# Patient Record
Sex: Female | Born: 1985
Health system: Southern US, Community
[De-identification: ages and names within clinical notes are randomized; demographics above are authoritative.]

## PROBLEM LIST (undated history)

## (undated) DIAGNOSIS — N390 Urinary tract infection, site not specified: Secondary | ICD-10-CM

## (undated) DIAGNOSIS — Z720 Tobacco use: Secondary | ICD-10-CM

## (undated) DIAGNOSIS — D649 Anemia, unspecified: Secondary | ICD-10-CM

## (undated) DIAGNOSIS — F1910 Other psychoactive substance abuse, uncomplicated: Secondary | ICD-10-CM

## (undated) DIAGNOSIS — R768 Other specified abnormal immunological findings in serum: Secondary | ICD-10-CM

## (undated) DIAGNOSIS — K219 Gastro-esophageal reflux disease without esophagitis: Secondary | ICD-10-CM

## (undated) HISTORY — PX: ABCESS DRAINAGE: SHX399

---
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
## 2003-06-11 ENCOUNTER — Emergency Department (HOSPITAL_COMMUNITY): Admission: EM | Admit: 2003-06-11 | Discharge: 2003-06-11 | Payer: Self-pay | Admitting: Emergency Medicine

## 2003-12-10 ENCOUNTER — Emergency Department (HOSPITAL_COMMUNITY): Admission: EM | Admit: 2003-12-10 | Discharge: 2003-12-10 | Payer: Self-pay | Admitting: Emergency Medicine

## 2010-04-29 ENCOUNTER — Emergency Department (HOSPITAL_COMMUNITY): Admission: EM | Admit: 2010-04-29 | Discharge: 2010-04-29 | Payer: Self-pay | Admitting: Family Medicine

## 2010-12-31 ENCOUNTER — Emergency Department (HOSPITAL_COMMUNITY)
Admission: EM | Admit: 2010-12-31 | Discharge: 2010-12-31 | Payer: Self-pay | Source: Home / Self Care | Admitting: Emergency Medicine

## 2011-02-20 LAB — POCT I-STAT, CHEM 8
BUN: 11 mg/dL (ref 6–23)
Calcium, Ion: 1.13 mmol/L (ref 1.12–1.32)
Chloride: 105 mEq/L (ref 96–112)
Creatinine, Ser: 0.8 mg/dL (ref 0.4–1.2)
Glucose, Bld: 97 mg/dL (ref 70–99)
HCT: 44 % (ref 36.0–46.0)
Hemoglobin: 15 g/dL (ref 12.0–15.0)
Potassium: 3.9 mEq/L (ref 3.5–5.1)
Sodium: 140 mEq/L (ref 135–145)
TCO2: 26 mmol/L (ref 0–100)

## 2011-02-20 LAB — POCT URINALYSIS DIP (DEVICE)
Glucose, UA: NEGATIVE mg/dL
Ketones, ur: NEGATIVE mg/dL
Nitrite: NEGATIVE
Protein, ur: >=300 mg/dL — AB
Specific Gravity, Urine: 1.015 (ref 1.005–1.030)
Urobilinogen, UA: 0.2 mg/dL (ref 0.0–1.0)
pH: >=9 (ref 5.0–8.0)

## 2011-02-20 LAB — POCT PREGNANCY, URINE: Preg Test, Ur: NEGATIVE

## 2011-03-15 ENCOUNTER — Emergency Department (HOSPITAL_COMMUNITY)
Admission: EM | Admit: 2011-03-15 | Discharge: 2011-03-15 | Disposition: A | Discharge status: Home / Self Care | Payer: Self-pay | Attending: Emergency Medicine | Admitting: Emergency Medicine

## 2011-03-15 DIAGNOSIS — J029 Acute pharyngitis, unspecified: Secondary | ICD-10-CM | POA: Insufficient documentation

## 2011-03-15 DIAGNOSIS — F17200 Nicotine dependence, unspecified, uncomplicated: Secondary | ICD-10-CM | POA: Insufficient documentation

## 2011-04-21 NOTE — Discharge Summary (Signed)
Bonnie Yang, Bonnie Yang                          ACCOUNT NO.:  [id number]   MEDICAL RECORD NO.:  [id number]                   PATIENT TYPE:  INP   LOCATION:  6711                                 FACILITY:  [facility]   PHYSICIAN:  Rene Paci, M.D. [facility]          DATE OF BIRTH:  18-Aug-1986   DATE OF ADMISSION:  12/23/2002  DATE OF DISCHARGE:  12/25/2002                                 DISCHARGE SUMMARY   DISCHARGE DIAGNOSES:  1. Pharyngitis.  2. Tonsillitis.  3. Otitis media.   HISTORY:  Ms. Catlin is an 25 year old African American female who had her  tongue pierced recently.  The patient's mother reported that she went to a  local hospital on December 10, 2002 secondary to sore throat and her tongue  swelling.  At that time the ring was removed from her tongue and she  received IV Rocephin and IV Cleocin.  She was then discharged home.  She was  re-evaluated at the [facility] emergency room on December 21, 2002 and  December 22, 2002 respectively and received Rocephin and IV steroids each  time.  She presented to Dr. Leta Jungling office on the day of admission because of  severe sore throat limiting her ability to swallow secondary to pain.  She  was also noted to have a low grade fever.   PAST MEDICAL HISTORY:  Negative.   HOSPITAL COURSE:  ENT.  The patient presented with severe tonsillitis and  pharyngitis and probable otitis media.  She was admitted and started on IV  antibiotics as well as IV steroids.  The patient's condition has slowly  improved.  She has defervesced.  The patient will be tapered to oral  steroids and oral antibiotics.  We have arranged for the patient to follow  up with ENT as an outpatient.   LABORATORY DATA:  Urine pregnancy was negative.  White count was 11.5,  hemoglobin 14.1.   PHYSICAL EXAMINATION:  VITALS:  At discharge she was afebrile.  Blood  pressure 104/56, respirations 18, pulse 86, oxygen saturation is 96% on room  air.  GENERAL:  This is a thin,  well-developed, well-nourished African American  female in no acute distress.  HEENT:  Oropharynx feels protruding, erythematous.  Tonsils with exudate on  the left tonsil.  She does have some cervical adenopathy.  The right ear  canal was occluded with cerumen.  Left ear canal also had a little bit of  cerumen but it was not occluded.  Her tympanic membrane was mildly hyperemic  without any evidence of discharge or bulging.  LUNGS:  Clear, without wheezes, rales or rhonchi.  CARDIOVASCULAR:  Regular, without murmurs, rubs or gallops.  Otherwise exam  was benign.   DISCHARGE MEDICATIONS:  1. Prednisone 20 mg taper over the next six days.  2. Augmentin 875 mg b.i.d. for 11 days.  3. Lortab elixir 15 ml every six hours p.r.n.    FOLLOW  UP:  She is to follow up with Dr. Nedra Hai on Friday, January 23 at 9:30  a.m. and Dr. Drue Novel Monday, January 26 at 3:45 p.m.     Cornell Barman, P.A. [facility]                  Rene Paci, M.D. [facility]    LC/MEDQ  D:  12/25/2002  T:  12/26/2002  Job:  [id number]

## 2011-10-21 ENCOUNTER — Emergency Department (HOSPITAL_COMMUNITY)
Admission: EM | Admit: 2011-10-21 | Discharge: 2011-10-21 | Disposition: A | Discharge status: Home / Self Care | Payer: Self-pay | Attending: Emergency Medicine | Admitting: Emergency Medicine

## 2011-10-21 ENCOUNTER — Emergency Department (HOSPITAL_COMMUNITY): Payer: Self-pay

## 2011-10-21 DIAGNOSIS — G5600 Carpal tunnel syndrome, unspecified upper limb: Secondary | ICD-10-CM | POA: Insufficient documentation

## 2011-10-21 DIAGNOSIS — G5601 Carpal tunnel syndrome, right upper limb: Secondary | ICD-10-CM

## 2011-10-21 DIAGNOSIS — M79609 Pain in unspecified limb: Secondary | ICD-10-CM | POA: Insufficient documentation

## 2011-10-21 DIAGNOSIS — F17200 Nicotine dependence, unspecified, uncomplicated: Secondary | ICD-10-CM | POA: Insufficient documentation

## 2011-10-21 MED ORDER — IBUPROFEN 800 MG PO TABS
800.0000 mg | ORAL_TABLET | Freq: Once | ORAL | Status: AC
  Administered 2011-10-21: 800 mg via ORAL
  Filled 2011-10-21: qty 1

## 2011-10-21 NOTE — ED Notes (Signed)
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  Pt says her grip is weaker in r hand as well.  Pt able to move fingers, radial pulse present.  Also reports hand has been tingling off and on.

## 2011-10-21 NOTE — ED Provider Notes (Signed)
History     CSN: [id number] Arrival date & time: 10/21/2011  4:25 PM   First MD Initiated Contact with Patient 10/21/11 1648      Chief Complaint  Patient presents with  . 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  She has pain in the R 3rd PIP and in the R thenar imminence.  She also describes dysthesia in the first 3  Fingers.    No prior history of carpal tunnel syndrome.  Patient is a 25 y.o. female presenting with hand pain. The history is provided by the patient. No language interpreter was used.  Hand Pain This is a new problem. The current episode started in the past 7 days. The problem occurs daily. The problem has been gradually worsening. She has tried NSAIDs for the symptoms. The treatment provided mild relief.    History reviewed. No pertinent past medical history.  History reviewed. No pertinent past surgical history.  No family history on file.  History  Substance Use Topics  . Smoking status: Current Everyday Smoker  . Smokeless tobacco: Not on file  . Alcohol Use: Yes     occasionally    OB History    Grav Para Term Preterm Abortions TAB SAB Ect Mult Living                  Review of Systems  Musculoskeletal:       Hand pain and tingling.  All other systems reviewed and are negative.    Allergies  Review of patient's allergies indicates no known allergies.  Home Medications  No current outpatient prescriptions on file.  BP 108/66  Pulse 81  Temp(Src) 98.1 F (36.7 C) (Oral)  Resp 18  Ht 5\' 5"  (1.651 m)  Wt 110 lb (49.896 kg)  BMI 18.31 kg/m2  SpO2 100%  LMP 10/13/2011  Physical Exam  Nursing note and vitals reviewed. Constitutional: She is oriented to person, place, and time. She appears well-developed and well-nourished. No distress.  HENT:  Head: Normocephalic and atraumatic.    Eyes: EOM are normal.  Neck: Normal range of motion.  Cardiovascular: Normal rate, regular rhythm and normal heart sounds.   Pulmonary/Chest: Effort normal and breath sounds normal.  Abdominal: Soft. She exhibits no distension. There is no tenderness.  Musculoskeletal: She exhibits tenderness.       Right hand: She exhibits decreased range of motion, tenderness and bony tenderness. She exhibits normal two-point discrimination, normal capillary refill, no deformity, no laceration and no swelling. normal sensation noted. Normal strength noted.       Hands: Neurological: She is alert and oriented to person, place, and time.  Skin: Skin is warm and dry.  Psychiatric: She has a normal mood and affect. Judgment normal.    ED Course  Procedures (including critical care time)  Labs Reviewed - No data to display No results found.   No diagnosis found.    MDM          Worthy Rancher, PA 10/21/11 Rickey Primus

## 2011-10-21 NOTE — ED Provider Notes (Signed)
Medical screening examination/treatment/procedure(s) were performed by non-physician practitioner and as supervising physician I was immediately available for consultation/collaboration.  Donnetta Hutching, MD 10/21/11 [phone]

## 2011-10-21 NOTE — ED Notes (Signed)
Pt a/ox4. Resp even and unlabored. NAD at this time. D/C instructions reviewed with pt. Pt verbalized understanding. Pt ambulated to POV with steady gate. 

## 2011-10-22 ENCOUNTER — Emergency Department (HOSPITAL_COMMUNITY)
Admission: EM | Admit: 2011-10-22 | Discharge: 2011-10-22 | Disposition: A | Discharge status: Home / Self Care | Payer: Self-pay | Attending: Emergency Medicine | Admitting: Emergency Medicine

## 2011-10-22 ENCOUNTER — Encounter (HOSPITAL_COMMUNITY): Payer: Self-pay

## 2011-10-22 DIAGNOSIS — M79609 Pain in unspecified limb: Secondary | ICD-10-CM | POA: Insufficient documentation

## 2011-10-22 DIAGNOSIS — M25539 Pain in unspecified wrist: Secondary | ICD-10-CM | POA: Insufficient documentation

## 2011-10-22 DIAGNOSIS — M79603 Pain in arm, unspecified: Secondary | ICD-10-CM

## 2011-10-22 DIAGNOSIS — F17200 Nicotine dependence, unspecified, uncomplicated: Secondary | ICD-10-CM | POA: Insufficient documentation

## 2011-10-22 DIAGNOSIS — G5600 Carpal tunnel syndrome, unspecified upper limb: Secondary | ICD-10-CM | POA: Insufficient documentation

## 2011-10-22 MED ORDER — HYDROCODONE-ACETAMINOPHEN 5-325 MG PO TABS: 1.0000 | ORAL_TABLET | ORAL | Status: AC | PRN

## 2011-10-22 MED ORDER — HYDROCODONE-ACETAMINOPHEN 5-325 MG PO TABS
1.0000 | ORAL_TABLET | Freq: Once | ORAL | Status: AC
  Administered 2011-10-22: 1 via ORAL
  Filled 2011-10-22: qty 1

## 2011-10-22 NOTE — ED Notes (Signed)
Pt seen her earlier today, given wrist splint to right wrist,  States pain is worse

## 2011-10-22 NOTE — ED Provider Notes (Signed)
History     CSN: [id number] Arrival date & time: 10/22/2011 12:38 AM   First MD Initiated Contact with Patient 10/22/11 0040      Chief Complaint  Patient presents with  . Arm Pain    (Consider location/radiation/quality/duration/timing/severity/associated sxs/prior treatment) HPI Comments: Bonnie Yang is a 25 y.o. female who presents to the Emergency Department complaining of left arm pain. Patient was seen in the ER earlier and diagnosed with carpel tunnel. She was placed in a velcro splint and advised to use an antiinflammatory. She states that the pain to her left arm got worse and did not respond to the antiinflammatory. She is not currently wearing her velcro splint.  Patient is a 25 y.o. female presenting with arm pain. The history is provided by the patient.  Arm Pain This is a new problem. The current episode started 6 to 12 hours ago. The problem occurs constantly. The problem has not changed since onset.The symptoms are aggravated by nothing. The symptoms are relieved by nothing.    History reviewed. No pertinent past medical history.  History reviewed. No pertinent past surgical history.  History reviewed. No pertinent family history.  History  Substance Use Topics  . Smoking status: Current Everyday Smoker  . Smokeless tobacco: Not on file  . Alcohol Use: Yes     occasionally    OB History    Grav Para Term Preterm Abortions TAB SAB Ect Mult Living                  Review of Systems  Musculoskeletal:       Left arm and wrist pain  All other systems reviewed and are negative.    Allergies  Review of patient's allergies indicates no known allergies.  Home Medications   Current Outpatient Rx  Name Route Sig Dispense Refill  . IBUPROFEN 800 MG PO TABS Oral Take 800 mg by mouth every 8 (eight) hours as needed.        BP 99/66  Pulse 88  Temp(Src) 98.3 F (36.8 C) (Oral)  Resp 16  SpO2 100%  LMP 10/13/2011  Physical Exam  Nursing note and  vitals reviewed. Constitutional: She is oriented to person, place, and time. She appears well-developed and well-nourished. No distress.  HENT:  Head: Normocephalic and atraumatic.  Eyes: EOM are normal.  Neck: Normal range of motion.  Cardiovascular: Normal rate, normal heart sounds and intact distal pulses.   Pulmonary/Chest: Effort normal and breath sounds normal.  Musculoskeletal:       Right upper arm: She exhibits no tenderness, no bony tenderness, no swelling, no edema and no deformity.       Right forearm: She exhibits no tenderness, no swelling, no edema and no deformity.       Right hand: She exhibits normal range of motion and no tenderness. normal sensation noted. Normal strength noted.  Neurological: She is alert and oriented to person, place, and time.  Skin: Skin is warm and dry.    ED Course  Procedures (including critical care time)  Labs Reviewed - No data to display Dg Hand Complete Right  10/21/2011  *RADIOLOGY REPORT*  Clinical Data: Hand pain, no injury  RIGHT HAND - COMPLETE 3+ VIEW  Comparison:  None.  Findings:  There is no evidence of fracture or dislocation.  There is no evidence of arthropathy or other focal bone abnormality. Soft tissues are unremarkable.  IMPRESSION: Negative.  Original Report Authenticated By: Camelia Phenes, M.D.  MDM  Patient here with continued left hand and arm pain that has not responded to antiinflammatory. Requesting stronger medicine. Xray done earlier with no acute findings. Patient was given a velcro splint which she was not wearing. Given analgesics.Pt stable in ED with no significant deterioration in condition.The patient appears reasonably screened and/or stabilized for discharge and I doubt any other medical condition or other [facility] requiring further screening, evaluation, or treatment in the ED at this time prior to discharge.  MDM Reviewed: previous chart, nursing note and vitals Reviewed previous:  x-ray          Nicoletta Dress. Colon Branch, MD 10/22/11 [phone]

## 2011-11-09 ENCOUNTER — Emergency Department (HOSPITAL_COMMUNITY): Payer: Self-pay

## 2011-11-09 ENCOUNTER — Emergency Department (HOSPITAL_COMMUNITY)
Admission: EM | Admit: 2011-11-09 | Discharge: 2011-11-09 | Disposition: A | Discharge status: Home / Self Care | Payer: Self-pay | Attending: Emergency Medicine | Admitting: Emergency Medicine

## 2011-11-09 ENCOUNTER — Encounter (HOSPITAL_COMMUNITY): Payer: Self-pay | Admitting: Emergency Medicine

## 2011-11-09 DIAGNOSIS — R1031 Right lower quadrant pain: Secondary | ICD-10-CM | POA: Insufficient documentation

## 2011-11-09 DIAGNOSIS — F17200 Nicotine dependence, unspecified, uncomplicated: Secondary | ICD-10-CM | POA: Insufficient documentation

## 2011-11-09 DIAGNOSIS — A499 Bacterial infection, unspecified: Secondary | ICD-10-CM | POA: Insufficient documentation

## 2011-11-09 DIAGNOSIS — N760 Acute vaginitis: Secondary | ICD-10-CM | POA: Insufficient documentation

## 2011-11-09 DIAGNOSIS — B9689 Other specified bacterial agents as the cause of diseases classified elsewhere: Secondary | ICD-10-CM | POA: Insufficient documentation

## 2011-11-09 DIAGNOSIS — N390 Urinary tract infection, site not specified: Secondary | ICD-10-CM | POA: Insufficient documentation

## 2011-11-09 DIAGNOSIS — R110 Nausea: Secondary | ICD-10-CM | POA: Insufficient documentation

## 2011-11-09 LAB — WET PREP, GENITAL
WBC, Wet Prep HPF POC: NONE SEEN
Yeast Wet Prep HPF POC: NONE SEEN

## 2011-11-09 LAB — DIFFERENTIAL
Basophils Absolute: 0 10*3/uL (ref 0.0–0.1)
Basophils Relative: 1 % (ref 0–1)
Lymphocytes Relative: 17 % (ref 12–46)
Monocytes Absolute: 1.2 10*3/uL — ABNORMAL HIGH (ref 0.1–1.0)
Monocytes Relative: 17 % — ABNORMAL HIGH (ref 3–12)
Neutro Abs: 4.7 10*3/uL (ref 1.7–7.7)
Neutrophils Relative %: 65 % (ref 43–77)

## 2011-11-09 LAB — BASIC METABOLIC PANEL
CO2: 26 mEq/L (ref 19–32)
Chloride: 102 mEq/L (ref 96–112)
GFR calc Af Amer: 82 mL/min — ABNORMAL LOW (ref >90)
Potassium: 3.6 mEq/L (ref 3.5–5.1)

## 2011-11-09 LAB — CBC
HCT: 38.5 % (ref 36.0–46.0)
Hemoglobin: 13 g/dL (ref 12.0–15.0)
MCHC: 33.8 g/dL (ref 30.0–36.0)
RDW: 13 % (ref 11.5–15.5)
WBC: 7.3 10*3/uL (ref 4.0–10.5)

## 2011-11-09 LAB — URINALYSIS, ROUTINE W REFLEX MICROSCOPIC
Glucose, UA: NEGATIVE mg/dL
Protein, ur: 100 mg/dL — AB
Urobilinogen, UA: 0.2 mg/dL (ref 0.0–1.0)

## 2011-11-09 LAB — URINE MICROSCOPIC-ADD ON

## 2011-11-09 LAB — PREGNANCY, URINE: Preg Test, Ur: NEGATIVE

## 2011-11-09 MED ORDER — OXYCODONE-ACETAMINOPHEN 5-325 MG PO TABS: 2.0000 | ORAL_TABLET | ORAL | Status: AC | PRN

## 2011-11-09 MED ORDER — IOHEXOL 300 MG/ML  SOLN
100.0000 mL | Freq: Once | INTRAMUSCULAR | Status: AC | PRN
  Administered 2011-11-09: 100 mL via INTRAVENOUS

## 2011-11-09 MED ORDER — SODIUM CHLORIDE 0.9 % IV SOLN
INTRAVENOUS | Status: DC
  Administered 2011-11-09: 1000 mL via INTRAVENOUS

## 2011-11-09 MED ORDER — NITROFURANTOIN MONOHYD MACRO 100 MG PO CAPS: 100.0000 mg | ORAL_CAPSULE | Freq: Two times a day (BID) | ORAL | Status: AC

## 2011-11-09 MED ORDER — ONDANSETRON HCL 4 MG/2ML IJ SOLN
4.0000 mg | Freq: Once | INTRAMUSCULAR | Status: AC
  Administered 2011-11-09: 4 mg via INTRAVENOUS
  Filled 2011-11-09: qty 2

## 2011-11-09 MED ORDER — ONDANSETRON HCL 4 MG PO TABS: 4.0000 mg | ORAL_TABLET | Freq: Four times a day (QID) | ORAL | Status: AC

## 2011-11-09 MED ORDER — METRONIDAZOLE 500 MG PO TABS: 500.0000 mg | ORAL_TABLET | Freq: Two times a day (BID) | ORAL | Status: AC

## 2011-11-09 NOTE — ED Notes (Signed)
Pt to CT

## 2011-11-09 NOTE — ED Provider Notes (Signed)
History     CSN: [id number] Arrival date & time: 11/09/2011  4:33 AM   First MD Initiated Contact with Patient 11/09/11 [phone]      Chief Complaint  Patient presents with  . Abdominal Pain  . Nausea    (Consider location/radiation/quality/duration/timing/severity/associated sxs/prior treatment) HPI Comments: Patient presents with right-sided abdominal pain is getting progressively worse for the past 2 days. It conincided with the started her menstrual period on Monday but this is nothing like her normal menstrual cramps. The pain is in the right lower quadrant and does not radiate. Nothing makes it better or worse. Associated with nausea but no vomiting. She denies any dysuria, hematuria, back pain. She denies any fever, shortness of breath, chest pain. She does endorse anorexia and frequent episodes of diarrhea yesterday.  Patient is a 25 y.o. female presenting with abdominal pain.  Abdominal Pain The primary symptoms of the illness include abdominal pain, nausea, diarrhea and vaginal bleeding. The primary symptoms of the illness do not include fever, shortness of breath, vomiting or dysuria. The current episode started 2 days ago. The problem has been gradually worsening.  The patient states that she believes she is currently not pregnant. The patient has not had a change in bowel habit. Additional symptoms associated with the illness include anorexia and back pain. Symptoms associated with the illness do not include frequency.    History reviewed. No pertinent past medical history.  History reviewed. No pertinent past surgical history.  No family history on file.  History  Substance Use Topics  . Smoking status: Current Some Day Smoker  . Smokeless tobacco: Not on file  . Alcohol Use: Yes     occasionally    OB History    Grav Para Term Preterm Abortions TAB SAB Ect Mult Living                  Review of Systems  Constitutional: Positive for activity change. Negative for  fever.  HENT: Negative for congestion and rhinorrhea.   Respiratory: Negative for cough and shortness of breath.   Cardiovascular: Negative for chest pain.  Gastrointestinal: Positive for nausea, abdominal pain, diarrhea and anorexia. Negative for vomiting.  Genitourinary: Positive for vaginal bleeding. Negative for dysuria and frequency.  Musculoskeletal: Positive for back pain.  Skin: Negative for rash.  Neurological: Negative for weakness and headaches.    Allergies  Review of patient's allergies indicates no known allergies.  Home Medications   Current Outpatient Rx  Name Route Sig Dispense Refill  . IBUPROFEN 800 MG PO TABS Oral Take 800 mg by mouth every 8 (eight) hours as needed.        BP 123/76  Pulse 85  Temp(Src) 97.9 F (36.6 C) (Oral)  Resp 18  Ht 5\' 5"  (1.651 m)  Wt 112 lb (50.803 kg)  BMI 18.64 kg/m2  SpO2 100%  LMP 11/06/2011  Physical Exam  Constitutional: She is oriented to person, place, and time. She appears well-developed and well-nourished. No distress.  HENT:  Head: Normocephalic and atraumatic.  Mouth/Throat: No oropharyngeal exudate.  Eyes: Pupils are equal, round, and reactive to light.  Neck: Normal range of motion.  Cardiovascular: Normal rate, regular rhythm and normal heart sounds.   Pulmonary/Chest: Effort normal and breath sounds normal. No respiratory distress.  Abdominal: Soft. There is tenderness. There is guarding.       Right lower quadrant tenderness at McBurney's point with guarding. Negative Rovsing sign negative obturator  Musculoskeletal: Normal range of motion. She  exhibits no edema and no tenderness.  Neurological: She is alert and oriented to person, place, and time. No cranial nerve deficit.  Skin: Skin is warm.    ED Course  Procedures (including critical care time)  Labs Reviewed  URINALYSIS, ROUTINE W REFLEX MICROSCOPIC - Abnormal; Notable for the following:    Color, Urine BROWN (*) BIOCHEMICALS MAY BE AFFECTED BY  COLOR   APPearance CLOUDY (*)    Specific Gravity, Urine >1.030 (*)    Hgb urine dipstick LARGE (*)    Bilirubin Urine SMALL (*)    Ketones, ur 15 (*)    Protein, ur 100 (*)    All other components within normal limits  URINE MICROSCOPIC-ADD ON - Abnormal; Notable for the following:    Squamous Epithelial / LPF FEW (*)    Bacteria, UA MANY (*)    All other components within normal limits  PREGNANCY, URINE  CBC  DIFFERENTIAL  BASIC METABOLIC PANEL  WET PREP, GENITAL  GC/CHLAMYDIA PROBE AMP, GENITAL   No results found.   No diagnosis found.    MDM  RLQ pain with nausea and anorexia.  Currently on period.  Will evaluate UA, HCG, pelvic.  May need imaging for appendix.  UA with blood as patient is on period.  Pelvic exam benign; no adnexal pain. Labs reviewed. CT pending to evaluate appendix.  Patient signed out to Dr Adriana Simas.      Glynn Octave, MD 11/09/11 [phone]

## 2011-11-09 NOTE — ED Notes (Signed)
C/o right sided pain since Monday; states period started Monday, but this doesn't feel like her normal menstrual cramps.

## 2011-11-09 NOTE — ED Provider Notes (Signed)
History     CSN: [id number] Arrival date & time: 11/09/2011  4:33 AM   First MD Initiated Contact with Patient 11/09/11 [phone]      Chief Complaint  Patient presents with  . Abdominal Pain  . Nausea    (Consider location/radiation/quality/duration/timing/severity/associated sxs/prior treatment) HPI  History reviewed. No pertinent past medical history.  History reviewed. No pertinent past surgical history.  No family history on file.  History  Substance Use Topics  . Smoking status: Current Some Day Smoker  . Smokeless tobacco: Not on file  . Alcohol Use: Yes     occasionally    OB History    Grav Para Term Preterm Abortions TAB SAB Ect Mult Living                  Review of Systems  Allergies  Review of patient's allergies indicates no known allergies.  Home Medications   Current Outpatient Rx  Name Route Sig Dispense Refill  . HYDROCODONE-ACETAMINOPHEN 10-325 MG PO TABS Oral Take 1 tablet by mouth every 6 (six) hours as needed. For pain     . IBUPROFEN 800 MG PO TABS Oral Take 800 mg by mouth every 8 (eight) hours as needed. For carpal tunnel    . THERA M PLUS PO TABS Oral Take 1 tablet by mouth daily.      Marland Kitchen METRONIDAZOLE 500 MG PO TABS Oral Take 1 tablet (500 mg total) by mouth 2 (two) times daily. 14 tablet 0  . NITROFURANTOIN MONOHYD MACRO 100 MG PO CAPS Oral Take 1 capsule (100 mg total) by mouth 2 (two) times daily. 10 capsule 0  . ONDANSETRON HCL 4 MG PO TABS Oral Take 1 tablet (4 mg total) by mouth every 6 (six) hours. 12 tablet 0  . OXYCODONE-ACETAMINOPHEN 5-325 MG PO TABS Oral Take 2 tablets by mouth every 4 (four) hours as needed for pain. 20 tablet 0    BP 114/57  Pulse 62  Temp(Src) 98.2 F (36.8 C) (Oral)  Resp 20  Ht 5\' 5"  (1.651 m)  Wt 112 lb (50.803 kg)  BMI 18.64 kg/m2  SpO2 99%  LMP 11/06/2011  Physical Exam  ED Course  Procedures (including critical care time)  Labs Reviewed  URINALYSIS, ROUTINE W REFLEX MICROSCOPIC -  Abnormal; Notable for the following:    Color, Urine BROWN (*) BIOCHEMICALS MAY BE AFFECTED BY COLOR   APPearance CLOUDY (*)    Specific Gravity, Urine >1.030 (*)    Hgb urine dipstick LARGE (*)    Bilirubin Urine SMALL (*)    Ketones, ur 15 (*)    Protein, ur 100 (*)    All other components within normal limits  URINE MICROSCOPIC-ADD ON - Abnormal; Notable for the following:    Squamous Epithelial / LPF FEW (*)    Bacteria, UA MANY (*)    All other components within normal limits  DIFFERENTIAL - Abnormal; Notable for the following:    Monocytes Relative 17 (*)    Monocytes Absolute 1.2 (*)    All other components within normal limits  BASIC METABOLIC PANEL - Abnormal; Notable for the following:    Glucose, Bld 102 (*)    GFR calc non Af Amer 70 (*)    GFR calc Af Amer 82 (*)    All other components within normal limits  WET PREP, GENITAL - Abnormal; Notable for the following:    Clue Cells, Wet Prep RARE (*)    All other components within normal limits  PREGNANCY, URINE  CBC  GC/CHLAMYDIA PROBE AMP, GENITAL   Ct Abdomen Pelvis W Contrast  11/09/2011  *RADIOLOGY REPORT*  Clinical Data: Right lower quadrant pain.  Nausea and anorexia.  CT ABDOMEN AND PELVIS WITH CONTRAST  Technique:  Multidetector CT imaging of the abdomen and pelvis was performed following the standard protocol during bolus administration of intravenous contrast.  Contrast: OMNIPAQUE IOHEXOL 300 MG/ML IV SOLN  Comparison: None  Findings:  The lung bases are clear.  No pericardial or pleural effusion. No focal liver abnormalities identified.  The spleen appears normal.  Normal appearance of the gallbladder.  No biliary dilatation.  The pancreas appears within normal limits.  There is mild asymmetric decreased enhancement of the right kidney with right-sided hydronephrosis and hydroureter.  Within the right side of the pelvis there is a calcification which measures 2.6 mm, image 66.  It is unclear whether not this  is a ureteral calculus or calcified phlebolith.  The left kidney appears normal.  No upper abdominal adenopathy.  There is no pelvic or inguinal adenopathy.  Fibroid is noted arising from the posterior wall of the uterus measuring 3.2 cm.  No free fluid or fluid collections within the abdomen or pelvis.  The stomach and the small bowel loops are within normal limits.  The appendix is identified and appears normal.  Normal appearance of the colon.  No free fluid or fluid collections within the abdomen or the pelvis.  Review of the visualized bony structures is unremarkable.  IMPRESSION:  1.  Normal appearing appendix. 2.  There is subtle decreased enhancement of the right kidney with right hydronephrosis and hydroureter.  This may represent sequela of ureteral calculus (there is an indeterminant calcific density within the right pelvis which may represent a ureteral stone versus calcified phlebolith).  Alternatively this could represent ascending urinary tract infection.  Careful clinical correlation is recommended.  Original Report Authenticated By: Rosealee Albee, M.D.     1. Abdominal pain   2. Urinary tract infection   3. Bacterial vaginosis       MDM  Recheck at 12:00. Acute abdomen. Feeling better. Discussed CT scan results.  We'll discharge home with medications for pain, nausea, antibiotic for urinary tract infection and bacterial vaginosis.        Donnetta Hutching, MD 11/09/11 1201

## 2011-11-09 NOTE — ED Notes (Signed)
Pt drinking CT contrast. States that her nausea and pain is much better. Family at bedside.

## 2011-11-10 LAB — GC/CHLAMYDIA PROBE AMP, GENITAL: GC Probe Amp, Genital: NEGATIVE

## 2011-11-20 ENCOUNTER — Encounter (HOSPITAL_COMMUNITY): Payer: Self-pay

## 2011-11-20 ENCOUNTER — Emergency Department (HOSPITAL_COMMUNITY)
Admission: EM | Admit: 2011-11-20 | Discharge: 2011-11-20 | Disposition: A | Discharge status: Home / Self Care | Payer: Self-pay | Attending: Emergency Medicine | Admitting: Emergency Medicine

## 2011-11-20 DIAGNOSIS — R3915 Urgency of urination: Secondary | ICD-10-CM | POA: Insufficient documentation

## 2011-11-20 DIAGNOSIS — N390 Urinary tract infection, site not specified: Secondary | ICD-10-CM | POA: Insufficient documentation

## 2011-11-20 DIAGNOSIS — R109 Unspecified abdominal pain: Secondary | ICD-10-CM | POA: Insufficient documentation

## 2011-11-20 DIAGNOSIS — Z79899 Other long term (current) drug therapy: Secondary | ICD-10-CM | POA: Insufficient documentation

## 2011-11-20 DIAGNOSIS — F17200 Nicotine dependence, unspecified, uncomplicated: Secondary | ICD-10-CM | POA: Insufficient documentation

## 2011-11-20 DIAGNOSIS — R110 Nausea: Secondary | ICD-10-CM | POA: Insufficient documentation

## 2011-11-20 DIAGNOSIS — R10819 Abdominal tenderness, unspecified site: Secondary | ICD-10-CM | POA: Insufficient documentation

## 2011-11-20 DIAGNOSIS — N23 Unspecified renal colic: Secondary | ICD-10-CM | POA: Insufficient documentation

## 2011-11-20 DIAGNOSIS — R350 Frequency of micturition: Secondary | ICD-10-CM | POA: Insufficient documentation

## 2011-11-20 LAB — CBC
MCH: 28.6 pg (ref 26.0–34.0)
Platelets: 190 10*3/uL (ref 150–400)
RBC: 4.55 MIL/uL (ref 3.87–5.11)
WBC: 6.4 10*3/uL (ref 4.0–10.5)

## 2011-11-20 LAB — URINE MICROSCOPIC-ADD ON

## 2011-11-20 LAB — URINALYSIS, ROUTINE W REFLEX MICROSCOPIC
Bilirubin Urine: NEGATIVE
Glucose, UA: NEGATIVE mg/dL
Hgb urine dipstick: NEGATIVE
Ketones, ur: NEGATIVE mg/dL
Leukocytes, UA: NEGATIVE
Nitrite: NEGATIVE
Specific Gravity, Urine: 1.015 (ref 1.005–1.030)
Urobilinogen, UA: 0.2 mg/dL (ref 0.0–1.0)
pH: 7 (ref 5.0–8.0)

## 2011-11-20 LAB — BASIC METABOLIC PANEL
Chloride: 103 mEq/L (ref 96–112)
GFR calc Af Amer: >90 mL/min (ref >90)
GFR calc non Af Amer: 78 mL/min — ABNORMAL LOW (ref >90)
Glucose, Bld: 110 mg/dL — ABNORMAL HIGH (ref 70–99)
Potassium: 3.9 mEq/L (ref 3.5–5.1)
Sodium: 137 mEq/L (ref 135–145)

## 2011-11-20 LAB — DIFFERENTIAL
Eosinophils Absolute: 0.1 10*3/uL (ref 0.0–0.7)
Lymphocytes Relative: 16 % (ref 12–46)
Lymphs Abs: 1 10*3/uL (ref 0.7–4.0)
Neutro Abs: 4.4 10*3/uL (ref 1.7–7.7)
Neutrophils Relative %: 69 % (ref 43–77)

## 2011-11-20 LAB — POCT PREGNANCY, URINE: Preg Test, Ur: NEGATIVE

## 2011-11-20 MED ORDER — ONDANSETRON HCL 8 MG PO TABS: 8.0000 mg | ORAL_TABLET | Freq: Once | ORAL | Status: AC

## 2011-11-20 MED ORDER — CEPHALEXIN 500 MG PO CAPS: 500.0000 mg | ORAL_CAPSULE | Freq: Four times a day (QID) | ORAL | Status: AC

## 2011-11-20 MED ORDER — OXYCODONE-ACETAMINOPHEN 5-325 MG PO TABS
2.0000 | ORAL_TABLET | Freq: Once | ORAL | Status: AC
  Administered 2011-11-20: 2 via ORAL
  Filled 2011-11-20: qty 2

## 2011-11-20 MED ORDER — IBUPROFEN 400 MG PO TABS
600.0000 mg | ORAL_TABLET | Freq: Once | ORAL | Status: AC
  Administered 2011-11-20: 600 mg via ORAL
  Filled 2011-11-20: qty 2

## 2011-11-20 MED ORDER — CEPHALEXIN 500 MG PO CAPS
500.0000 mg | ORAL_CAPSULE | Freq: Once | ORAL | Status: AC
  Administered 2011-11-20: 500 mg via ORAL
  Filled 2011-11-20: qty 1

## 2011-11-20 MED ORDER — ONDANSETRON HCL 4 MG PO TABS
8.0000 mg | ORAL_TABLET | Freq: Once | ORAL | Status: AC
  Administered 2011-11-20: 8 mg via ORAL
  Filled 2011-11-20: qty 2

## 2011-11-20 MED ORDER — IBUPROFEN 600 MG PO TABS: 600.0000 mg | ORAL_TABLET | Freq: Once | ORAL | Status: AC

## 2011-11-20 MED ORDER — OXYCODONE-ACETAMINOPHEN 5-325 MG PO TABS: 2.0000 | ORAL_TABLET | ORAL | Status: AC | PRN

## 2011-11-20 NOTE — ED Notes (Signed)
Pt reports was diagnosed last week with kidney infection.  Pt took antibiotics and symptoms improved but now is getting worse.  Pt c/o pain with urination and r lower side pain.  C/O nausea, no vomiting.

## 2011-11-20 NOTE — ED Provider Notes (Signed)
Scribed for Bonnie Bonier, MD, the patient was seen in room APA08/APA08 . This chart was scribed by Ellie Lunch.   CSN: [id number] Arrival date & time: 11/20/2011  7:10 AM   First MD Initiated Contact with Patient 11/20/11 [phone]      Chief Complaint  Patient presents with  . Pyelonephritis    (Consider location/radiation/quality/duration/timing/severity/associated sxs/prior treatment) HPI Pt seen at 7:24 AM Bonnie Yang is a 25 y.o. female who presents to the Emergency Department complaining of right flank pain. Pt has recently seen in ED 12/6 for kidney infection in the past week. Sx resolved with prescribed abx. No pain for 2-3 days after discharge but sudden onset pain since yesterday with associated nausea and painful, more frequent and urgent urination. Pt denies any fever, vomiting, or hematuria or vaginal discharge.  LNMP 11/06/11.  No past medical history on file.  No past surgical history on file.  No family history on file.  History  Substance Use Topics  . Smoking status: Current Some Day Smoker  . Smokeless tobacco: Not on file  . Alcohol Use: Yes     occasionally    Review of Systems 10 Systems reviewed and are negative for acute change except as noted in the HPI.  Allergies  Review of patient's allergies indicates no known allergies.  Home Medications   Current Outpatient Rx  Name Route Sig Dispense Refill  . HYDROCODONE-ACETAMINOPHEN 10-325 MG PO TABS Oral Take 1 tablet by mouth every 6 (six) hours as needed. For pain     . IBUPROFEN 800 MG PO TABS Oral Take 800 mg by mouth every 8 (eight) hours as needed. For carpal tunnel    . METRONIDAZOLE 500 MG PO TABS Oral Take 1 tablet (500 mg total) by mouth 2 (two) times daily. 14 tablet 0  . THERA M PLUS PO TABS Oral Take 1 tablet by mouth daily.      Marland Kitchen NITROFURANTOIN MONOHYD MACRO 100 MG PO CAPS Oral Take 1 capsule (100 mg total) by mouth 2 (two) times daily. 10 capsule 0  . OXYCODONE-ACETAMINOPHEN 5-325  MG PO TABS Oral Take 2 tablets by mouth every 4 (four) hours as needed for pain. 20 tablet 0    BP 102/75  Pulse 88  Temp(Src) 98.1 F (36.7 C) (Oral)  Resp 18  Ht 5\' 5"  (1.651 m)  Wt 113 lb (51.256 kg)  BMI 18.80 kg/m2  SpO2 99%  LMP 11/06/2011  Physical Exam  Nursing note and vitals reviewed. Constitutional: She is oriented to person, place, and time. She appears well-developed and well-nourished. No distress.  HENT:  Head: Normocephalic and atraumatic.  Mouth/Throat: Oropharynx is clear and moist and mucous membranes are normal.  Eyes: Pupils are equal, round, and reactive to light. No scleral icterus.  Neck: Normal range of motion. Neck supple.  Cardiovascular: Normal rate and regular rhythm.  Exam reveals no gallop and no friction rub.   No murmur heard. Pulmonary/Chest: Effort normal and breath sounds normal. She has no wheezes. She has no rales.       No rhonchi   Abdominal: Soft. Bowel sounds are normal. There is tenderness (tender superpubic region/right flank no significant tenderness RLQ). There is no rebound and no guarding.  Neurological: She is alert and oriented to person, place, and time.  Skin: Skin is warm and dry.    ED Course  Procedures (including critical care time) DIAGNOSTIC STUDIES: Oxygen Saturation is 99% on room air, normal by my interpretation.  COORDINATION OF CARE:  Labs Reviewed  URINALYSIS, ROUTINE W REFLEX MICROSCOPIC - Abnormal; Notable for the following:    Protein, ur TRACE (*)    All other components within normal limits  DIFFERENTIAL - Abnormal; Notable for the following:    Monocytes Relative 13 (*)    All other components within normal limits  BASIC METABOLIC PANEL - Abnormal; Notable for the following:    Glucose, Bld 110 (*)    GFR calc non Af Amer 78 (*)    All other components within normal limits  URINE MICROSCOPIC-ADD ON - Abnormal; Notable for the following:    Squamous Epithelial / LPF FEW (*)    Bacteria, UA FEW  (*)    All other components within normal limits  CBC  POCT PREGNANCY, URINE  URINE CULTURE  POCT PREGNANCY, URINE    No diagnosis found.   MDM  7:31 AM Patient with signs and symptoms of recurrent urinary tract infection possibly pyelonephritis. We will repeat a urine analysis and send a urine culture and also check electrolytes and complete blood count. I do not think that this is appendicitis, PID, or a gastroenteric problems based on the history and physical examination.  9:00 AM The patient's pain has improved with treatment. Her urinalysis is not overly impressive for urinary tract infection, but her symptoms are. In this case, I will treat the patient clinically for urinary tract infection failing initial treatment with Macrobid. I will start her on Keflex for a 10 day course. I have a urine culture pending for followup purposes in case symptoms do not resolve. Otherwise I will treat the patient symptomatically with pain medication and anti-emetics.  I personally performed the services described in this documentation, which was scribed in my presence. The recorded information has been reviewed and considered.    Bonnie Bonier, MD 11/20/11 0900

## 2011-11-21 LAB — URINE CULTURE
Colony Count: NO GROWTH
Culture: NO GROWTH

## 2012-01-25 ENCOUNTER — Emergency Department (HOSPITAL_COMMUNITY)
Admission: EM | Admit: 2012-01-25 | Discharge: 2012-01-25 | Disposition: A | Discharge status: Home / Self Care | Payer: Self-pay | Attending: Emergency Medicine | Admitting: Emergency Medicine

## 2012-01-25 ENCOUNTER — Encounter (HOSPITAL_COMMUNITY): Payer: Self-pay | Admitting: Emergency Medicine

## 2012-01-25 DIAGNOSIS — M25539 Pain in unspecified wrist: Secondary | ICD-10-CM | POA: Insufficient documentation

## 2012-01-25 DIAGNOSIS — F17200 Nicotine dependence, unspecified, uncomplicated: Secondary | ICD-10-CM | POA: Insufficient documentation

## 2012-01-25 NOTE — ED Provider Notes (Signed)
History   This chart was scribed for Bonnie Gaskins, MD by Clarita Crane. The patient was seen in room APA06/APA06. Patient's care was started at 0654.    CSN: [id number]  Arrival date & time 01/25/12  1478   First MD Initiated Contact with Patient 01/25/12 0701      Chief Complaint  Patient presents with  . Wrist Pain     HPI Bonnie VINCIGUERRA is a 26 y.o. female who presents to the Emergency Department complaining of constant moderate left wrist pain onset several days ago and worsening since with associated swelling to region and tingling to the distal aspects of left fingers. Patient also notes experiencing similar pain to right hand/wrist but notes she was previously diagnosed with carpal tunnel within right hand/wrist.  Reports pain is aggravated with gripping of objects with hands and relieved by nothing. Patient notes performing repetitive motion with hands while at work. Denies numbness, weakness of extremities, neck pain.   Past medical history.  -Carpal Tunnel, right hand  History reviewed. No pertinent past surgical history.  History reviewed. No pertinent family history.  History  Substance Use Topics  . Smoking status: Current Some Day Smoker  . Smokeless tobacco: Not on file  . Alcohol Use: Yes     occasionally    OB History    Grav Para Term Preterm Abortions TAB SAB Ect Mult Living                  Review of Systems 10 Systems reviewed and are negative for acute change except as noted in the HPI.  Allergies  Review of patient's allergies indicates no known allergies.  Home Medications   Current Outpatient Rx  Name Route Sig Dispense Refill  . HYDROCODONE-ACETAMINOPHEN 10-325 MG PO TABS Oral Take 1 tablet by mouth every 6 (six) hours as needed. For pain     . IBUPROFEN 800 MG PO TABS Oral Take 800 mg by mouth every 8 (eight) hours as needed. For carpal tunnel    . THERA M PLUS PO TABS Oral Take 1 tablet by mouth daily.        BP 119/64  Pulse 90   Temp(Src) 98.3 F (36.8 C) (Oral)  Resp 18  Ht 5\' 5"  (1.651 m)  Wt 111 lb (50.349 kg)  BMI 18.47 kg/m2  SpO2 100%  LMP 01/04/2012  Physical Exam CONSTITUTIONAL: Well developed/well nourished HEAD AND FACE: Normocephalic/atraumatic EYES: EOMI/PERRL ENMT: Mucous membranes moist NECK: supple no meningeal signs CV: S1/S2 noted, no murmurs/rubs/gallops noted LUNGS: Lungs are clear to auscultation bilaterally, no apparent distress ABDOMEN: soft, nontender, no rebound or guarding NEURO: Pt is awake/alert, moves all extremitiesx4 EXTREMITIES: pulses normal, full ROM, radial pulses normal and equal bilaterally, tender to thenar eminence of bilateral hands, no edema noted, no distal sensory deficit noted. Equal hand grip noted SKIN: warm, color normal PSYCH: no abnormalities of mood noted  ED Course  Procedures   DIAGNOSTIC STUDIES:  COORDINATION OF CARE: 7:17AM- Patient informed of current plan for treatment and evaluation. Will d/c home with referral to Occupational Health. Patient agrees with plan.       MDM  Nursing notes reviewed and considered in documentation Previous records reviewed and considered       I personally performed the services described in this documentation, which was scribed in my presence. The recorded information has been reviewed and considered.      Bonnie Gaskins, MD 01/25/12 [phone]

## 2012-01-25 NOTE — ED Notes (Signed)
Pt states has been told she has carpel tunnel on right arm and now having similar pain in left.

## 2012-04-05 ENCOUNTER — Emergency Department (HOSPITAL_COMMUNITY): Payer: Self-pay

## 2012-04-05 ENCOUNTER — Emergency Department (HOSPITAL_COMMUNITY)
Admission: EM | Admit: 2012-04-05 | Discharge: 2012-04-05 | Disposition: A | Discharge status: Home / Self Care | Payer: Self-pay | Attending: Emergency Medicine | Admitting: Emergency Medicine

## 2012-04-05 ENCOUNTER — Encounter (HOSPITAL_COMMUNITY): Payer: Self-pay | Admitting: Emergency Medicine

## 2012-04-05 DIAGNOSIS — R059 Cough, unspecified: Secondary | ICD-10-CM | POA: Insufficient documentation

## 2012-04-05 DIAGNOSIS — R05 Cough: Secondary | ICD-10-CM | POA: Insufficient documentation

## 2012-04-05 DIAGNOSIS — J40 Bronchitis, not specified as acute or chronic: Secondary | ICD-10-CM | POA: Insufficient documentation

## 2012-04-05 MED ORDER — GUAIFENESIN-CODEINE 100-10 MG/5ML PO SYRP: 10.0000 mL | ORAL_SOLUTION | Freq: Three times a day (TID) | ORAL | Status: AC | PRN

## 2012-04-05 NOTE — Discharge Instructions (Signed)
Bronchitis Bronchitis is a problem of the air tubes leading to your lungs. This problem makes it hard for air to get in and out of the lungs. You may cough a lot because your air tubes are narrow. Going without care can cause lasting (chronic) bronchitis. HOME CARE   Drink enough fluids to keep your pee (urine) clear or pale yellow.   Use a cool mist humidifier.   Quit smoking if you smoke. If you keep smoking, the bronchitis might not get better.   Only take medicine as told by your doctor.  GET HELP RIGHT AWAY IF:   Coughing keeps you awake.   You start to wheeze.   You become more sick or weak.   You have a hard time breathing or get short of breath.   You cough up blood.   Coughing lasts more than 2 weeks.   You have a fever.   Your baby is older than 3 months with a rectal temperature of 102 F (38.9 C) or higher.   Your baby is 76 months old or younger with a rectal temperature of 100.4 F (38 C) or higher.  MAKE SURE YOU:  Understand these instructions.   Will watch your condition.   Will get help right away if you are not doing well or get worse.  Document Released: 05/08/2008 Document Revised: 11/09/2011 Document Reviewed: 10/22/2009 [facility] Patient Information 2012 [place], [place].   Take the robitussin [facility] after work and the robitussin DM during the day.  Return if your symptoms worsen or change significantly.

## 2012-04-05 NOTE — ED Notes (Signed)
Pt c/o sneezing/prod cough with yellow sputum and headache x 4 days. Heavy sweat last night. Head congestion noted.

## 2012-04-05 NOTE — ED Notes (Signed)
Pt c/o cough, scratchy throat and nasal congestion for a couple days. Alert and oriented x 3. Skin warm and dry. Color pink. Breath sounds clear and equal bilaterally.

## 2012-04-05 NOTE — ED Provider Notes (Signed)
History     CSN: [id number]  Arrival date & time 04/05/12  7829   First MD Initiated Contact with Patient 04/05/12 [phone]      Chief Complaint  Patient presents with  . Cough    (Consider location/radiation/quality/duration/timing/severity/associated sxs/prior treatment) Patient is a 26 y.o. female presenting with cough. The history is provided by the patient. No language interpreter was used.  Cough This is a new problem. The current episode started 2 days ago. The problem occurs every few minutes. The problem has not changed since onset.The cough is productive of sputum. There has been no fever. Pertinent negatives include no chills, no sore throat, no shortness of breath and no wheezing. She has tried decongestants for the symptoms. The treatment provided no relief. She is not a smoker. Her past medical history does not include bronchitis, pneumonia, COPD, emphysema or asthma.    History reviewed. No pertinent past medical history.  History reviewed. No pertinent past surgical history.  History reviewed. No pertinent family history.  History  Substance Use Topics  . Smoking status: Current Some Day Smoker  . Smokeless tobacco: Not on file  . Alcohol Use: Yes     occasionally    OB History    Grav Para Term Preterm Abortions TAB SAB Ect Mult Living                  Review of Systems  Constitutional: Negative for fever and chills.  HENT: Negative for sore throat.   Respiratory: Positive for cough. Negative for shortness of breath, wheezing and stridor.   All other systems reviewed and are negative.    Allergies  Review of patient's allergies indicates no known allergies.  Home Medications   Current Outpatient Rx  Name Route Sig Dispense Refill  . THERA M PLUS PO TABS Oral Take 1 tablet by mouth daily.      . GUAIFENESIN-CODEINE 100-10 MG/5ML PO SYRP Oral Take 10 mLs by mouth 3 (three) times daily as needed for cough (10 mls q 4-6 hrs prn cough). 240 mL 0    BP  104/86  Pulse 89  Temp 97.8 F (36.6 C)  Resp 18  SpO2 100%  LMP 03/30/2012  Physical Exam  Nursing note and vitals reviewed. Constitutional: She is oriented to person, place, and time. She appears well-developed and well-nourished. No distress.  HENT:  Head: Normocephalic and atraumatic.       Nasal congestion.  Eyes: EOM are normal.  Neck: Normal range of motion.  Cardiovascular: Normal rate, regular rhythm and normal heart sounds.   Pulmonary/Chest: Effort normal and breath sounds normal. No accessory muscle usage. Not tachypneic. No respiratory distress. She has no decreased breath sounds. She has no wheezes. She has no rhonchi. She has no rales. She exhibits no tenderness.  Abdominal: Soft. She exhibits no distension. There is no tenderness.  Musculoskeletal: Normal range of motion.  Neurological: She is alert and oriented to person, place, and time.  Skin: Skin is warm and dry.  Psychiatric: She has a normal mood and affect. Judgment normal.    ED Course  Procedures (including critical care time)  Labs Reviewed - No data to display Dg Chest 2 View  04/05/2012  *RADIOLOGY REPORT*  Clinical Data: Cough, congestion.  CHEST - 2 VIEW  Comparison: None.  Findings: Heart and mediastinal contours are within normal limits. No focal opacities or effusions.  No acute bony abnormality.  IMPRESSION: No active cardiopulmonary disease.  Original Report Authenticated By: Caryn Bee  G. DOVER, M.D.     1. Bronchitis       MDM  No PNA.  Return prn         Worthy Rancher, [place] 04/05/12 1047

## 2012-04-06 NOTE — ED Provider Notes (Signed)
Medical screening examination/treatment/procedure(s) were performed by non-physician practitioner and as supervising physician I was immediately available for consultation/collaboration.   Julena Barbour W Alcides Nutting, MD 04/06/12 0735 

## 2012-08-06 ENCOUNTER — Emergency Department (HOSPITAL_COMMUNITY)
Admission: EM | Admit: 2012-08-06 | Discharge: 2012-08-06 | Disposition: A | Discharge status: Home / Self Care | Payer: Self-pay | Source: Home / Self Care | Attending: Family Medicine | Admitting: Family Medicine

## 2012-08-13 ENCOUNTER — Encounter (HOSPITAL_COMMUNITY): Payer: Self-pay | Admitting: *Deleted

## 2012-08-13 ENCOUNTER — Emergency Department (HOSPITAL_COMMUNITY)
Admission: EM | Admit: 2012-08-13 | Discharge: 2012-08-13 | Disposition: A | Discharge status: Home / Self Care | Payer: Self-pay | Attending: Emergency Medicine | Admitting: Emergency Medicine

## 2012-08-13 DIAGNOSIS — R05 Cough: Secondary | ICD-10-CM | POA: Insufficient documentation

## 2012-08-13 DIAGNOSIS — R059 Cough, unspecified: Secondary | ICD-10-CM | POA: Insufficient documentation

## 2012-08-13 DIAGNOSIS — J329 Chronic sinusitis, unspecified: Secondary | ICD-10-CM

## 2012-08-13 DIAGNOSIS — J3489 Other specified disorders of nose and nasal sinuses: Secondary | ICD-10-CM | POA: Insufficient documentation

## 2012-08-13 MED ORDER — AMOXICILLIN-POT CLAVULANATE 875-125 MG PO TABS: 1.0000 | ORAL_TABLET | Freq: Two times a day (BID) | ORAL | Status: AC

## 2012-08-13 MED ORDER — AMOXICILLIN-POT CLAVULANATE 875-125 MG PO TABS
1.0000 | ORAL_TABLET | Freq: Once | ORAL | Status: AC
  Administered 2012-08-13: 1 via ORAL
  Filled 2012-08-13: qty 1

## 2012-08-13 NOTE — ED Provider Notes (Signed)
History    This chart was scribed for Donnetta Hutching, MD, MD by Smitty Pluck. The patient was seen in room APA06 and the patient's care was started at 7:30AM.   CSN: [id number]  Arrival date & time 08/13/12  2841   First MD Initiated Contact with Patient 08/13/12 0730      Chief Complaint  Patient presents with  . Cough  . Facial Pain  . URI    (Consider location/radiation/quality/duration/timing/severity/associated sxs/prior treatment) Patient is a 26 y.o. female presenting with cough and URI. The history is provided by the patient. No language interpreter was used.  Cough  URI The primary symptoms include cough.   Bonnie Yang is a 26 y.o. female who presents to the Emergency Department complaining of moderate nasal congestion, frontal sinus pressure, nonproductive cough and rhinorrhea onset 1 day ago. Pt reports symptoms have gradually worsened since onset. She had fever 1 day ago that has subsided. Pt reports that she smokes. Pt denies any other pain currently.    History reviewed. No pertinent past medical history.  History reviewed. No pertinent past surgical history.  No family history on file.  History  Substance Use Topics  . Smoking status: Current Some Day Smoker  . Smokeless tobacco: Not on file  . Alcohol Use: Yes     occasionally    OB History    Grav Para Term Preterm Abortions TAB SAB Ect Mult Living                  Review of Systems  Respiratory: Positive for cough.   All other systems reviewed and are negative.  10 Systems reviewed and all are negative for acute change except as noted in the HPI.    Allergies  Review of patient's allergies indicates no known allergies.  Home Medications   Current Outpatient Rx  Name Route Sig Dispense Refill  . THERA M PLUS PO TABS Oral Take 1 tablet by mouth daily.        BP 103/74  Pulse 79  Temp 98.5 F (36.9 C)  Resp 18  Ht 5\' 5"  (1.651 m)  Wt 103 lb (46.72 kg)  BMI 17.14 kg/m2  SpO2 98%   LMP 07/23/2012  Physical Exam  Nursing note and vitals reviewed. Constitutional: She is oriented to person, place, and time. She appears well-developed and well-nourished. No distress.  HENT:  Head: Normocephalic and atraumatic.       Tender over frontal sinuses   Eyes: Conjunctivae and EOM are normal. Pupils are equal, round, and reactive to light.  Neck: Normal range of motion. Neck supple.  Cardiovascular: Normal rate, regular rhythm and normal heart sounds.   Pulmonary/Chest: Effort normal and breath sounds normal.  Abdominal: Soft. Bowel sounds are normal.  Musculoskeletal: Normal range of motion.  Neurological: She is alert and oriented to person, place, and time.  Skin: Skin is warm and dry.  Psychiatric: She has a normal mood and affect. Her behavior is normal.    ED Course  Procedures (including critical care time) DIAGNOSTIC STUDIES: Oxygen Saturation is 98% on room air, normal by my interpretation.    COORDINATION OF CARE: 7:33 AM Discussed pt ED treatment with pt. (augmentin) 7:50 AM Ordered:  Medications  amoxicillin-clavulanate (AUGMENTIN) 875-125 MG per tablet 1 tablet (not administered)       Labs Reviewed - No data to display No results found.   No diagnosis found.    MDM  Patient is nontoxic. Suspect frontal sinusitis. Rx  Augmentin      I personally performed the services described in this documentation, which was scribed in my presence. The recorded information has been reviewed and considered.    Donnetta Hutching, MD 08/13/12 [phone]

## 2012-08-13 NOTE — ED Notes (Signed)
Pt c/o cold symptoms, URI, sinus pressure that started on Sunday, developed into dry cough yesterday.

## 2012-08-13 NOTE — ED Notes (Signed)
Patient with no complaints at this time. Respirations even and unlabored. Skin warm/dry. Discharge instructions reviewed with patient at this time. Patient given opportunity to voice concerns/ask questions. Patient discharged at this time and left Emergency Department with steady gait.   

## 2012-09-14 ENCOUNTER — Encounter (HOSPITAL_COMMUNITY): Payer: Self-pay | Admitting: Emergency Medicine

## 2012-09-14 ENCOUNTER — Emergency Department (HOSPITAL_COMMUNITY)
Admission: EM | Admit: 2012-09-14 | Discharge: 2012-09-14 | Disposition: A | Discharge status: Home / Self Care | Payer: Self-pay | Attending: Emergency Medicine | Admitting: Emergency Medicine

## 2012-09-14 DIAGNOSIS — K0889 Other specified disorders of teeth and supporting structures: Secondary | ICD-10-CM

## 2012-09-14 DIAGNOSIS — F17200 Nicotine dependence, unspecified, uncomplicated: Secondary | ICD-10-CM | POA: Insufficient documentation

## 2012-09-14 DIAGNOSIS — Z8249 Family history of ischemic heart disease and other diseases of the circulatory system: Secondary | ICD-10-CM | POA: Insufficient documentation

## 2012-09-14 DIAGNOSIS — K089 Disorder of teeth and supporting structures, unspecified: Secondary | ICD-10-CM | POA: Insufficient documentation

## 2012-09-14 MED ORDER — HYDROCODONE-ACETAMINOPHEN 5-325 MG PO TABS: 1.0000 | ORAL_TABLET | Freq: Four times a day (QID) | ORAL | Status: AC | PRN

## 2012-09-14 MED ORDER — PENICILLIN V POTASSIUM 500 MG PO TABS: 500.0000 mg | ORAL_TABLET | Freq: Four times a day (QID) | ORAL | Status: AC

## 2012-09-14 NOTE — ED Provider Notes (Signed)
History     CSN: [id number]  Arrival date & time 09/14/12  1024   First MD Initiated Contact with Patient 09/14/12 1041      Chief Complaint  Patient presents with  . Dental Pain    (Consider location/radiation/quality/duration/timing/severity/associated sxs/prior treatment) HPI Comments: States she has been having R lower molar pain ~ 1 month but worse in past several days.  No fever or chills.  No dentist.  Patient is a 26 y.o. female presenting with tooth pain. The history is provided by the patient. No language interpreter was used.  Dental PainThe primary symptoms include mouth pain. Primary symptoms do not include fever or sore throat. The symptoms are worsening. The symptoms occur constantly.  Additional symptoms include: jaw pain. Additional symptoms do not include: purulent gums, facial swelling, trouble swallowing and swollen glands.    History reviewed. No pertinent past medical history.  History reviewed. No pertinent past surgical history.  Family History  Problem Relation Age of Onset  . Hypertension Mother     History  Substance Use Topics  . Smoking status: Current Every Day Smoker    Types: Cigarettes  . Smokeless tobacco: Not on file  . Alcohol Use: Yes     1I beer ocasionally    OB History    Grav Para Term Preterm Abortions TAB SAB Ect Mult Living                  Review of Systems  Constitutional: Negative for fever and chills.  HENT: Positive for dental problem. Negative for sore throat, facial swelling and trouble swallowing.   All other systems reviewed and are negative.    Allergies  Review of patient's allergies indicates no known allergies.  Home Medications   Current Outpatient Rx  Name Route Sig Dispense Refill  . HYDROCODONE-ACETAMINOPHEN 5-325 MG PO TABS Oral Take 1 tablet by mouth every 6 (six) hours as needed for pain. 20 tablet 0  . THERA M PLUS PO TABS Oral Take 1 tablet by mouth daily.      Marland Kitchen PENICILLIN V POTASSIUM 500  MG PO TABS Oral Take 1 tablet (500 mg total) by mouth 4 (four) times daily. 40 tablet 0    BP 135/73  Pulse 87  Temp 98.5 F (36.9 C) (Oral)  Resp 18  Ht 5\' 5"  (1.651 m)  Wt 110 lb (49.896 kg)  BMI 18.31 kg/m2  SpO2 97%  LMP 08/25/2012  Physical Exam  Nursing note and vitals reviewed. Constitutional: She is oriented to person, place, and time. She appears well-developed and well-nourished. No distress.  HENT:  Head: Normocephalic and atraumatic.  Mouth/Throat: Uvula is midline, oropharynx is clear and moist and mucous membranes are normal. Normal dentition. No dental abscesses, uvula swelling or dental caries.    Eyes: EOM are normal.  Neck: Normal range of motion.  Cardiovascular: Normal rate, regular rhythm and normal heart sounds.   Pulmonary/Chest: Effort normal and breath sounds normal.  Abdominal: Soft. She exhibits no distension. There is no tenderness.  Musculoskeletal: Normal range of motion.  Neurological: She is alert and oriented to person, place, and time.  Skin: Skin is warm and dry.  Psychiatric: She has a normal mood and affect. Judgment normal.    ED Course  Procedures (including critical care time)  Labs Reviewed - No data to display No results found.   1. Pain, dental       MDM  rx-hydrocodone, 20 rx-pen VK , 40 F/u with oral surgeon.  Evalina Field, [place] 09/14/12 1644

## 2012-09-14 NOTE — ED Notes (Signed)
Pt reports that she has had wisdom tooth pain x1 month. Pt reports that this one has been hurting for so long that I didn't know if something was wrong. Pt reports that she has been taking ibuprofen OTC for pain with minimal relief. Pt reports sensitivity to the right side of her mouth. NAD noted.

## 2012-09-14 NOTE — ED Notes (Signed)
Pt. Cont/ c/o dental pain.  Reviewed Rxs and Dental resourse information.  Stable at present. Discharged from ED w/stable gait.

## 2012-09-15 NOTE — ED Provider Notes (Signed)
Medical screening examination/treatment/procedure(s) were performed by non-physician practitioner and as supervising physician I was immediately available for consultation/collaboration.   Charles B. Sheldon, MD 09/15/12 0713 

## 2013-05-01 ENCOUNTER — Encounter (HOSPITAL_COMMUNITY): Payer: Self-pay | Admitting: *Deleted

## 2013-05-01 ENCOUNTER — Emergency Department (HOSPITAL_COMMUNITY)
Admission: EM | Admit: 2013-05-01 | Discharge: 2013-05-01 | Disposition: A | Discharge status: Home / Self Care | Payer: Self-pay | Attending: Emergency Medicine | Admitting: Emergency Medicine

## 2013-05-01 DIAGNOSIS — R599 Enlarged lymph nodes, unspecified: Secondary | ICD-10-CM | POA: Insufficient documentation

## 2013-05-01 DIAGNOSIS — Z79899 Other long term (current) drug therapy: Secondary | ICD-10-CM | POA: Insufficient documentation

## 2013-05-01 DIAGNOSIS — Z3202 Encounter for pregnancy test, result negative: Secondary | ICD-10-CM | POA: Insufficient documentation

## 2013-05-01 DIAGNOSIS — F17200 Nicotine dependence, unspecified, uncomplicated: Secondary | ICD-10-CM | POA: Insufficient documentation

## 2013-05-01 MED ORDER — CEPHALEXIN 500 MG PO CAPS
500.0000 mg | ORAL_CAPSULE | Freq: Once | ORAL | Status: AC
  Administered 2013-05-01: 500 mg via ORAL
  Filled 2013-05-01: qty 1

## 2013-05-01 MED ORDER — IBUPROFEN 800 MG PO TABS
800.0000 mg | ORAL_TABLET | Freq: Once | ORAL | Status: AC
  Administered 2013-05-01: 800 mg via ORAL
  Filled 2013-05-01: qty 1

## 2013-05-01 MED ORDER — CEPHALEXIN 500 MG PO CAPS: 500.0000 mg | ORAL_CAPSULE | Freq: Four times a day (QID) | ORAL | Status: DC

## 2013-05-01 MED ORDER — IBUPROFEN 600 MG PO TABS: 600.0000 mg | ORAL_TABLET | Freq: Four times a day (QID) | ORAL | Status: DC | PRN

## 2013-05-01 NOTE — ED Notes (Signed)
Pt noticed knots under her left arm this morning that were sore to the touch.  Denies any drainage. Pt w/ no PCP to contact.

## 2013-05-01 NOTE — ED Notes (Signed)
Pt has small swollen area lt axilla , noticed today when showering.

## 2013-05-02 ENCOUNTER — Other Ambulatory Visit (HOSPITAL_COMMUNITY): Payer: Self-pay | Admitting: Emergency Medicine

## 2013-05-02 DIAGNOSIS — R599 Enlarged lymph nodes, unspecified: Secondary | ICD-10-CM

## 2013-05-02 NOTE — ED Notes (Signed)
Call from radiology this morning stating they only did Mammo [place] here on Wednesday. Pt could go to the breast center in [place] where test are done M- F. Nurse called pt at home  And she opted to come here for the [place].

## 2013-05-05 NOTE — ED Provider Notes (Signed)
Medical screening examination/treatment/procedure(s) were performed by non-physician practitioner and as supervising physician I was immediately available for consultation/collaboration.   Benny Lennert, MD 05/05/13 2030

## 2013-05-05 NOTE — ED Provider Notes (Signed)
History     CSN: [id number]  Arrival date & time 05/01/13  2010   First MD Initiated Contact with Patient 05/01/13 2107      Chief Complaint  Patient presents with  . Abscess    (Consider location/radiation/quality/duration/timing/severity/associated sxs/prior treatment) HPI Comments: Bonnie Yang is a 27 y.o. Female presenting with a tender "knot" along her left breast which she noticed this morning while showering.  She denies fevers, chills, there has been no drainage from the site and she denies any history of injury to the area.  She is an otherwise healthy patient,  Her LMP was 04/23/13,  Doubts pregnancy although is sexually active and not using birth control.  She denies breast enlargement and has had no nipple discharge.  Her family history is significant for a maternal aunt who developed breast ca,  But no 1st degree relatives with this history.  She is symptom free unless palpating the site.  There is no radiation of her symptoms.     The history is provided by the patient and a relative.    History reviewed. No pertinent past medical history.  History reviewed. No pertinent past surgical history.  Family History  Problem Relation Age of Onset  . Hypertension Mother     History  Substance Use Topics  . Smoking status: Current Every Day Smoker    Types: Cigarettes  . Smokeless tobacco: Not on file  . Alcohol Use: Yes     Comment: 1I beer ocasionally    OB History   Grav Para Term Preterm Abortions TAB SAB Ect Mult Living                  Review of Systems  Constitutional: Negative for fever, chills and appetite change.  HENT: Negative for facial swelling.   Respiratory: Negative for shortness of breath and wheezing.   Gastrointestinal: Negative for nausea.  Skin: Negative for wound.  Neurological: Negative for numbness.    Allergies  Review of patient's allergies indicates no known allergies.  Home Medications   Current Outpatient Rx  Name   Route  Sig  Dispense  Refill  . acetaminophen (TYLENOL) 325 MG tablet   Oral   Take 650 mg by mouth every 6 (six) hours as needed for pain.          . Multiple Vitamins-Minerals (MULTIVITAMINS THER. W/MINERALS) TABS   Oral   Take 1 tablet by mouth daily.           . cephALEXin (KEFLEX) 500 MG capsule   Oral   Take 1 capsule (500 mg total) by mouth 4 (four) times daily.   40 capsule   0   . ibuprofen (ADVIL,MOTRIN) 600 MG tablet   Oral   Take 1 tablet (600 mg total) by mouth every 6 (six) hours as needed for pain.   20 tablet   0     BP 136/67  Pulse 84  Temp(Src) 98 F (36.7 C) (Oral)  Resp 18  Ht 5\' 6"  (1.676 m)  Wt 112 lb (50.803 kg)  BMI 18.09 kg/m2  SpO2 100%  LMP 04/23/2013  Physical Exam  Nursing note and vitals reviewed. Constitutional: She appears well-developed and well-nourished.  HENT:  Head: Normocephalic and atraumatic.  Eyes: Conjunctivae are normal.  Neck: Normal range of motion.  Cardiovascular: Normal rate, regular rhythm, normal heart sounds and intact distal pulses.   Pulmonary/Chest: Effort normal and breath sounds normal. She has no wheezes. She exhibits tenderness.  Small tender nodule at 3 oclock position left lateral breast,  Freely mobile.  No skin erythema or drainage, no fluctuance or surrounding erythema.  Breast exam is negative for mass, axilla negative for adenopathy.  There is a 2 x 3 patch of slightly scaly, pink rash inferior to the nodule consistent with her history of eczema, but no evidence of infection.  Abdominal: Soft. Bowel sounds are normal. There is no tenderness.  Musculoskeletal: Normal range of motion.  Neurological: She is alert.  Skin: Skin is warm and dry.  Psychiatric: She has a normal mood and affect.    ED Course  Procedures (including critical care time)  Labs Reviewed  POCT PREGNANCY, URINE   No results found.   1. Enlarged lymph node       MDM  Pt was encouraged to minimize manipulating  area,  Advised warm compresses, ibuprofen,  Keflex prescribed for suspected inflammatory vs infected lymph node.  She was scheduled for a diagnostic US/mammogram.  Radiology to schedule in am.  Pt agrees with treatment plan.  Further management pending results of this study.        Burgess Amor, PA-C 05/05/13 1519

## 2013-05-07 ENCOUNTER — Other Ambulatory Visit (HOSPITAL_COMMUNITY): Payer: Self-pay

## 2013-05-23 IMAGING — CR DG HAND COMPLETE 3+V*R*
3 series · 3 of 3 positions shown · non-contrast
Comparison: None.

CLINICAL DATA: Hand pain, no injury

RIGHT HAND - COMPLETE 3+ VIEW

[view not recorded (1 of 3)]
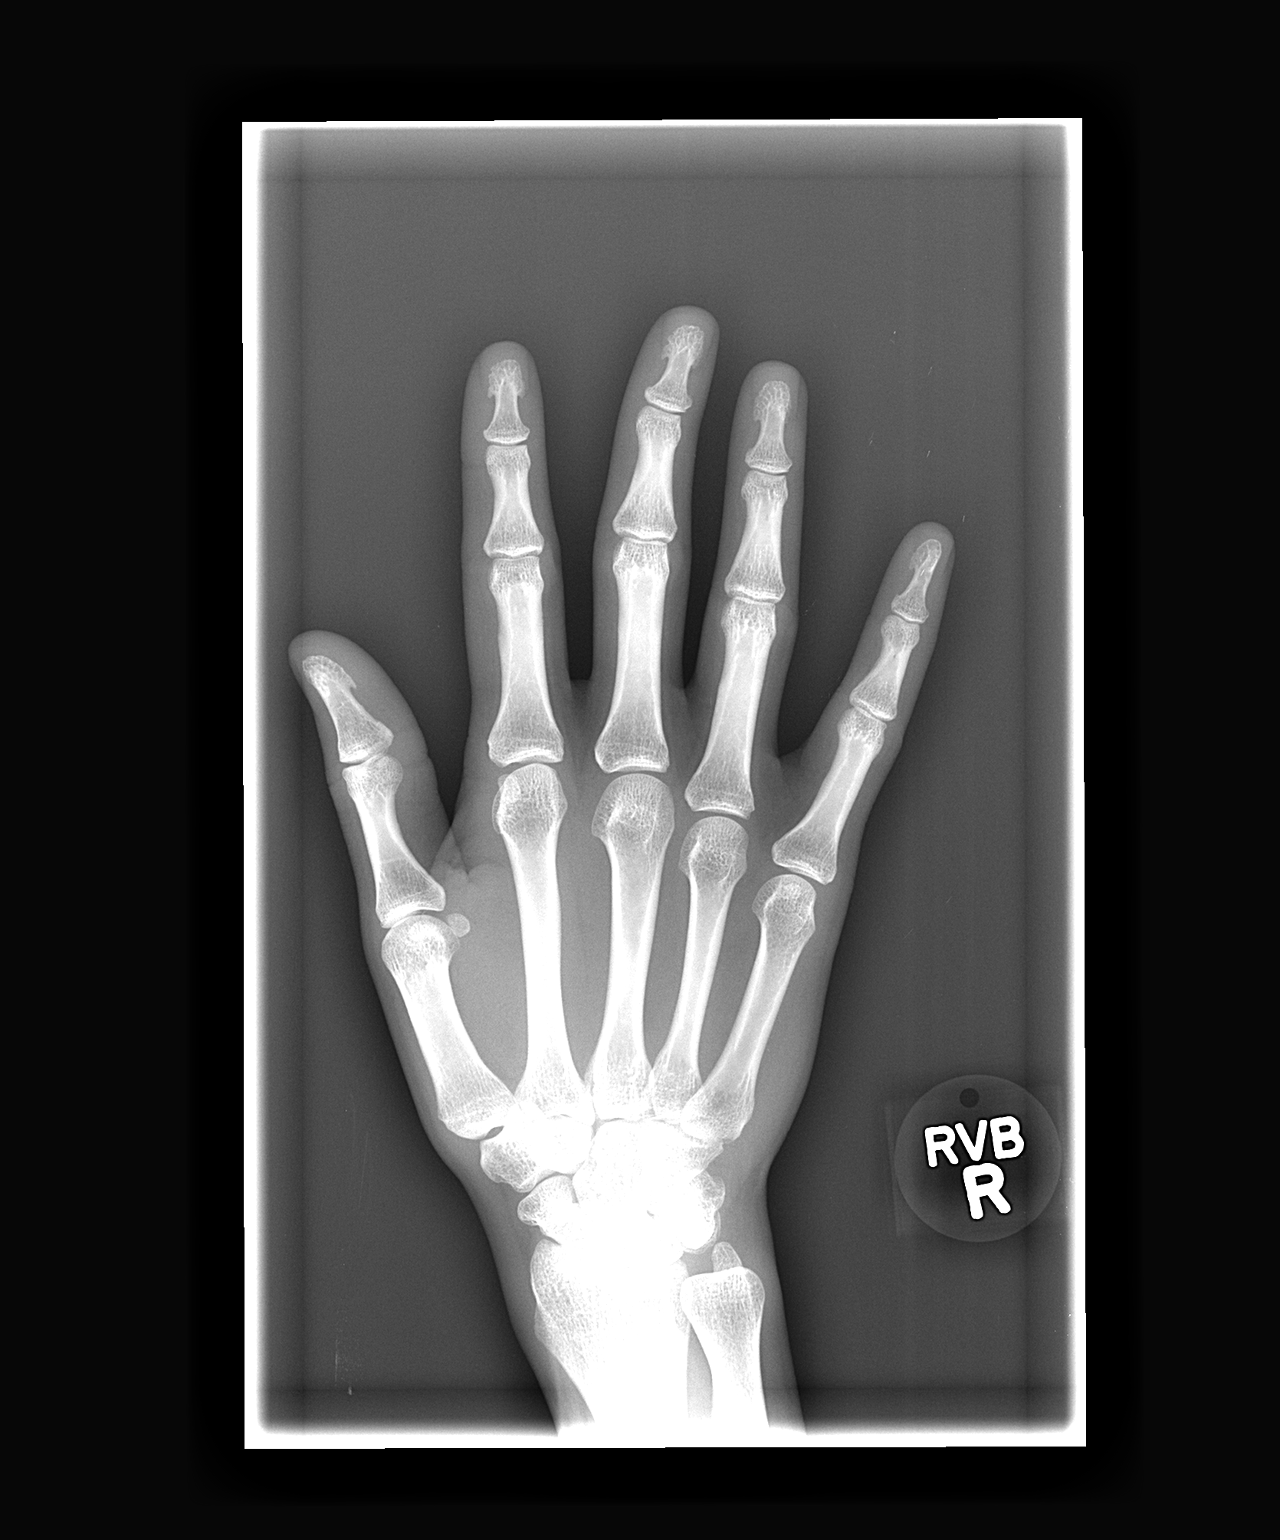

[view not recorded (2 of 3)]
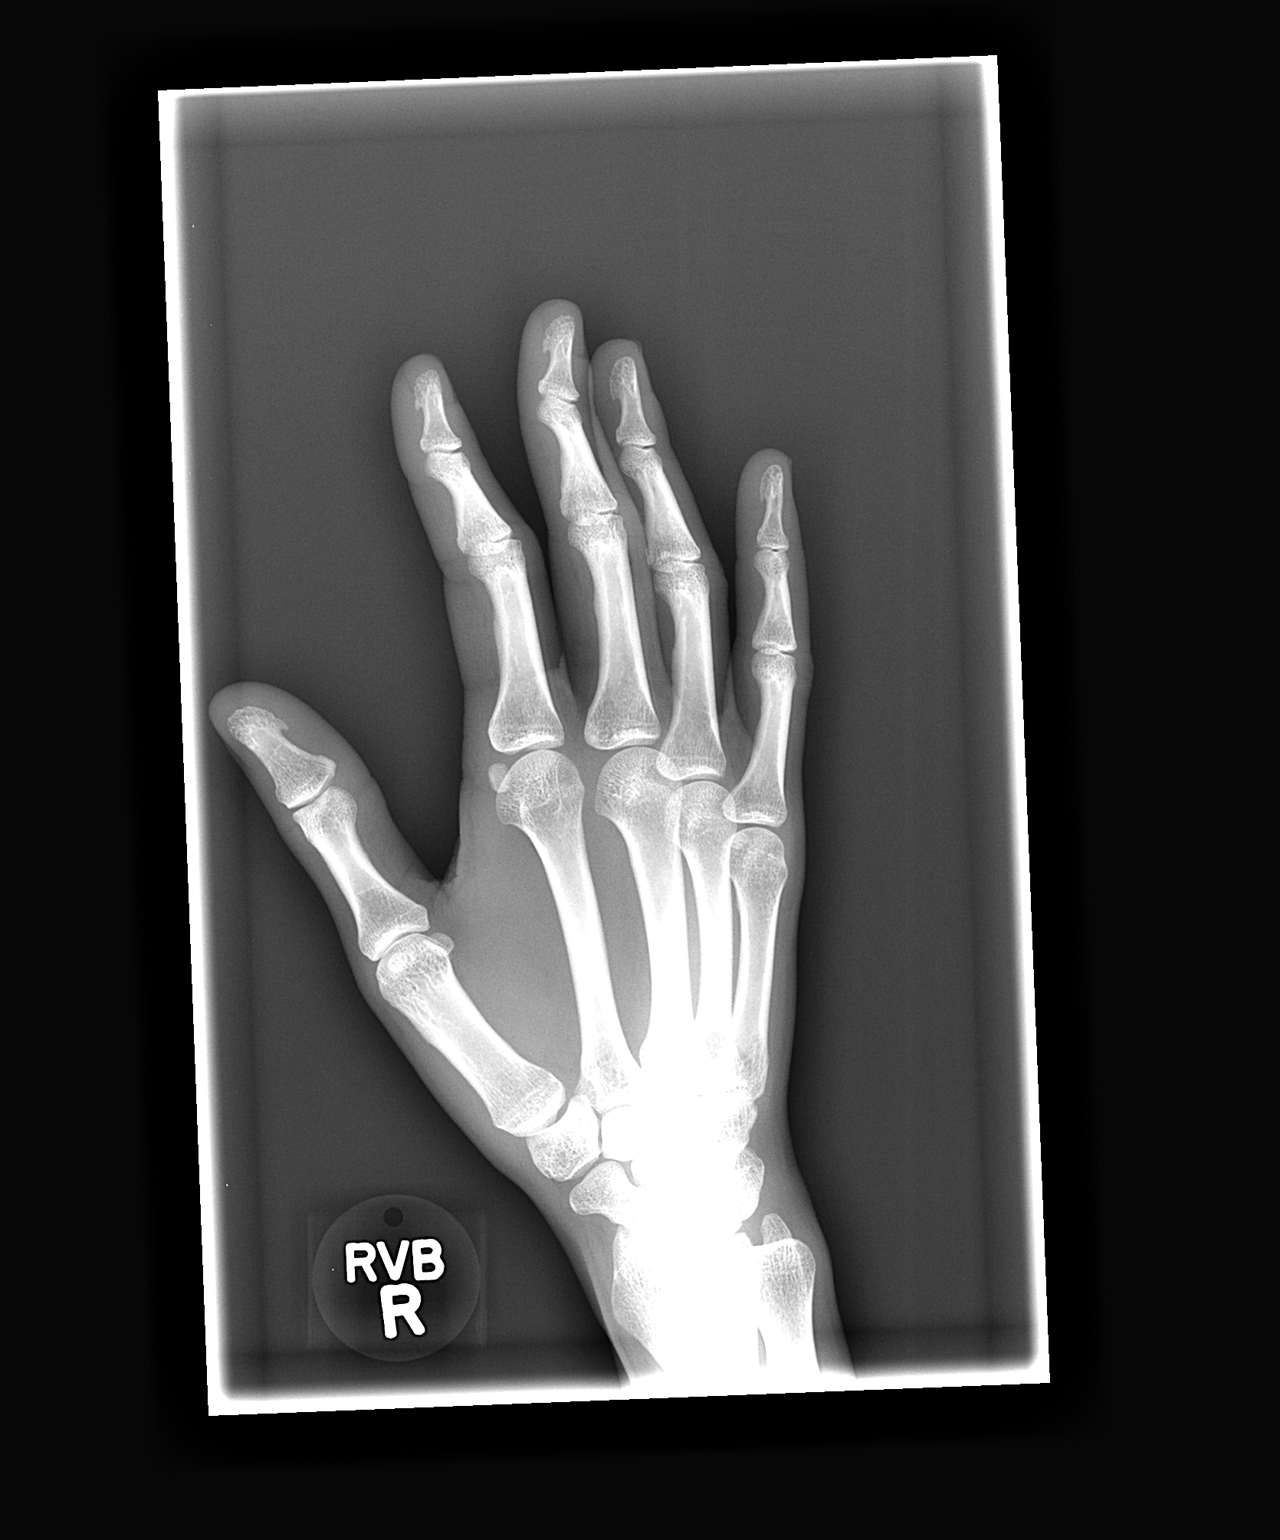

[view not recorded (3 of 3)]
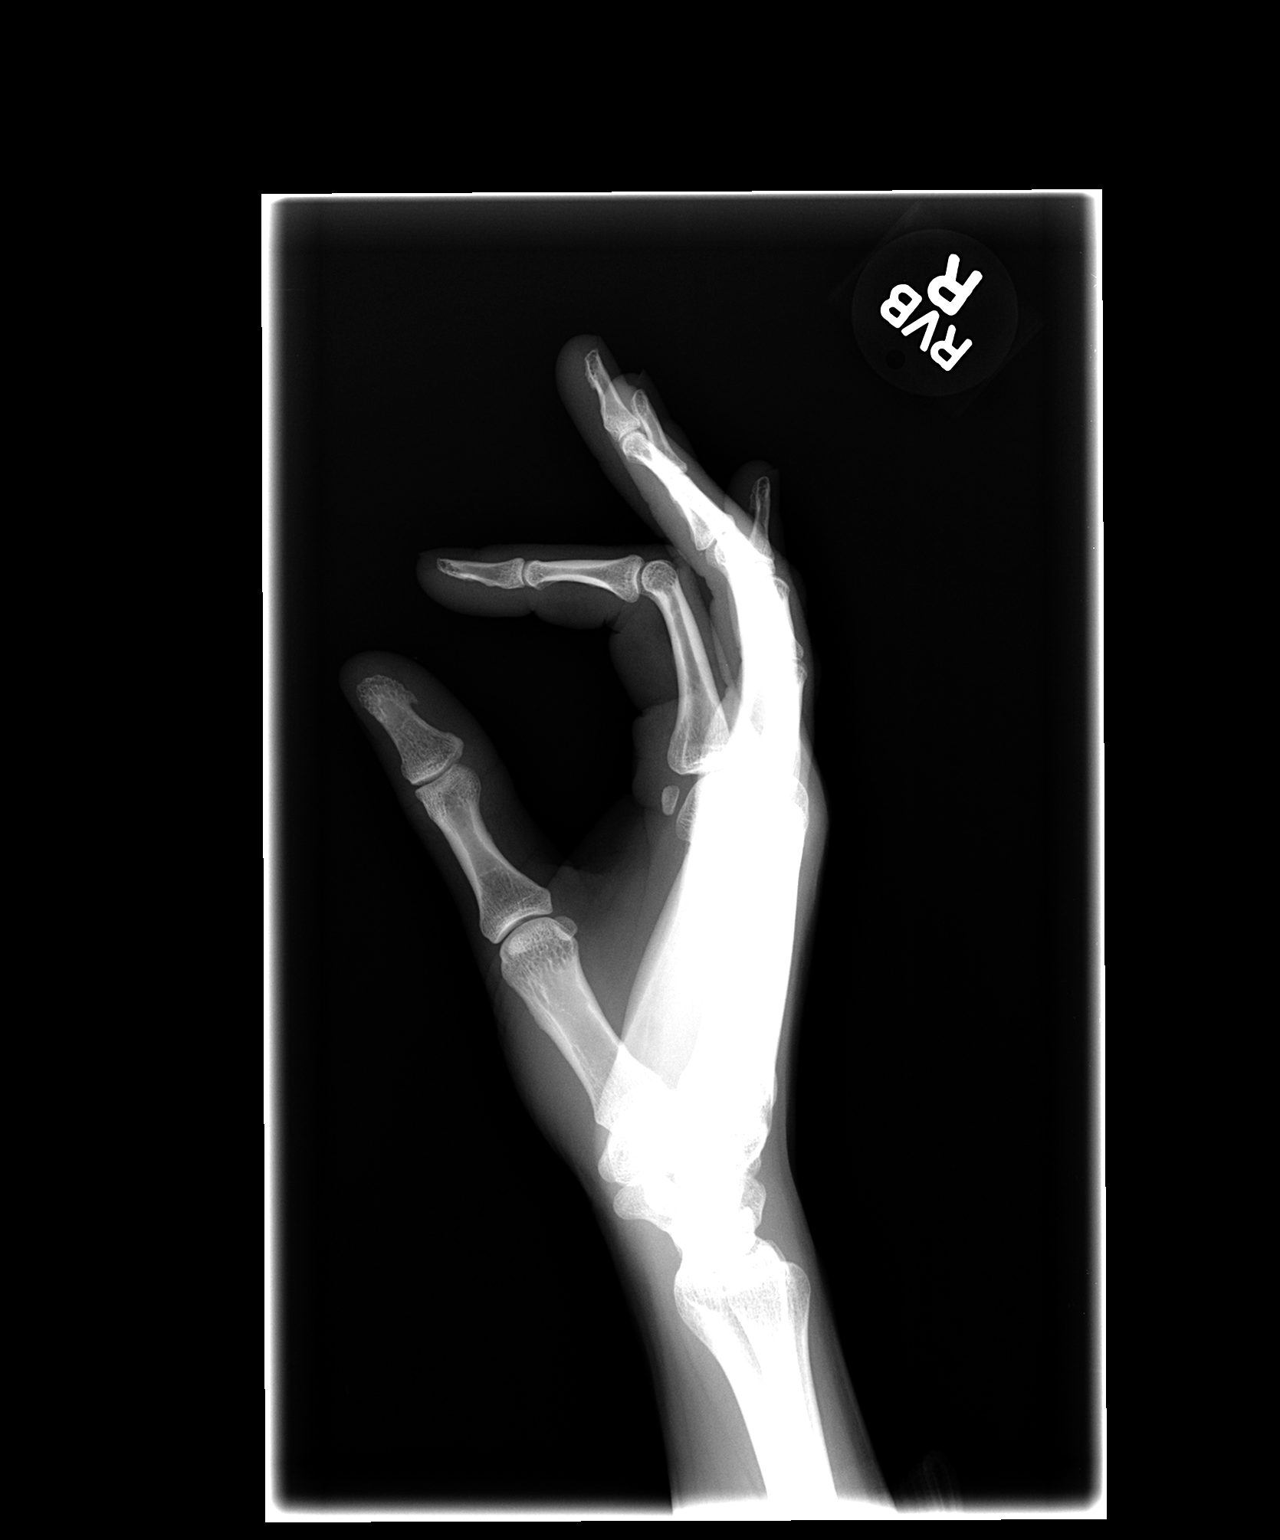

[3 of 3 positions shown; findings below may reference images not displayed]

FINDINGS: There is no evidence of fracture or dislocation.  There
is no evidence of arthropathy or other focal bone abnormality.
Soft tissues are unremarkable.
IMPRESSION: Negative.

## 2013-05-26 ENCOUNTER — Encounter (HOSPITAL_COMMUNITY): Payer: Self-pay | Admitting: Emergency Medicine

## 2013-05-26 ENCOUNTER — Emergency Department (HOSPITAL_COMMUNITY)
Admission: EM | Admit: 2013-05-26 | Discharge: 2013-05-26 | Disposition: A | Discharge status: Home / Self Care | Payer: Self-pay | Attending: Emergency Medicine | Admitting: Emergency Medicine

## 2013-05-26 DIAGNOSIS — R5381 Other malaise: Secondary | ICD-10-CM | POA: Insufficient documentation

## 2013-05-26 DIAGNOSIS — H8110 Benign paroxysmal vertigo, unspecified ear: Secondary | ICD-10-CM | POA: Insufficient documentation

## 2013-05-26 DIAGNOSIS — Z3202 Encounter for pregnancy test, result negative: Secondary | ICD-10-CM | POA: Insufficient documentation

## 2013-05-26 DIAGNOSIS — H6120 Impacted cerumen, unspecified ear: Secondary | ICD-10-CM | POA: Insufficient documentation

## 2013-05-26 DIAGNOSIS — H532 Diplopia: Secondary | ICD-10-CM | POA: Insufficient documentation

## 2013-05-26 DIAGNOSIS — F17200 Nicotine dependence, unspecified, uncomplicated: Secondary | ICD-10-CM | POA: Insufficient documentation

## 2013-05-26 DIAGNOSIS — R209 Unspecified disturbances of skin sensation: Secondary | ICD-10-CM | POA: Insufficient documentation

## 2013-05-26 DIAGNOSIS — R110 Nausea: Secondary | ICD-10-CM | POA: Insufficient documentation

## 2013-05-26 DIAGNOSIS — H5500 Unspecified nystagmus: Secondary | ICD-10-CM | POA: Insufficient documentation

## 2013-05-26 DIAGNOSIS — R42 Dizziness and giddiness: Secondary | ICD-10-CM | POA: Insufficient documentation

## 2013-05-26 DIAGNOSIS — H9209 Otalgia, unspecified ear: Secondary | ICD-10-CM | POA: Insufficient documentation

## 2013-05-26 LAB — CBC WITH DIFFERENTIAL/PLATELET
Basophils Absolute: 0 10*3/uL (ref 0.0–0.1)
Basophils Relative: 1 % (ref 0–1)
Eosinophils Absolute: 0.1 K/uL (ref 0.0–0.7)
Eosinophils Relative: 2 % (ref 0–5)
HCT: 39.5 % (ref 36.0–46.0)
Hemoglobin: 13.1 g/dL (ref 12.0–15.0)
Lymphocytes Relative: 22 % (ref 12–46)
Lymphs Abs: 1.1 10*3/uL (ref 0.7–4.0)
MCH: 29.3 pg (ref 26.0–34.0)
MCHC: 33.2 g/dL (ref 30.0–36.0)
MCV: 88.4 fL (ref 78.0–100.0)
Monocytes Absolute: 0.6 10*3/uL (ref 0.1–1.0)
Monocytes Relative: 12 % (ref 3–12)
Neutro Abs: 3.3 10*3/uL (ref 1.7–7.7)
Neutrophils Relative %: 64 % (ref 43–77)
Platelets: 202 10*3/uL (ref 150–400)
RBC: 4.47 MIL/uL (ref 3.87–5.11)
RDW: 13.7 % (ref 11.5–15.5)
WBC: 5.2 10*3/uL (ref 4.0–10.5)

## 2013-05-26 LAB — COMPREHENSIVE METABOLIC PANEL
Albumin: 4 g/dL (ref 3.5–5.2)
Alkaline Phosphatase: 56 U/L (ref 39–117)
BUN: 10 mg/dL (ref 6–23)
Creatinine, Ser: 0.69 mg/dL (ref 0.50–1.10)
GFR calc Af Amer: >90 mL/min (ref >90)
Glucose, Bld: 111 mg/dL — ABNORMAL HIGH (ref 70–99)
Total Protein: 7.4 g/dL (ref 6.0–8.3)

## 2013-05-26 LAB — COMPREHENSIVE METABOLIC PANEL WITH GFR
ALT: 10 U/L (ref 0–35)
AST: 14 U/L (ref 0–37)
CO2: 25 meq/L (ref 19–32)
Calcium: 8.7 mg/dL (ref 8.4–10.5)
Chloride: 104 meq/L (ref 96–112)
GFR calc non Af Amer: >90 mL/min (ref >90)
Potassium: 4 meq/L (ref 3.5–5.1)
Sodium: 137 meq/L (ref 135–145)
Total Bilirubin: 0.2 mg/dL — ABNORMAL LOW (ref 0.3–1.2)

## 2013-05-26 LAB — POCT PREGNANCY, URINE: Preg Test, Ur: NEGATIVE

## 2013-05-26 MED ORDER — MECLIZINE HCL 25 MG PO TABS
25.0000 mg | ORAL_TABLET | Freq: Once | ORAL | Status: AC
  Administered 2013-05-26: 25 mg via ORAL
  Filled 2013-05-26: qty 1

## 2013-05-26 MED ORDER — ACETAMINOPHEN 325 MG PO TABS
650.0000 mg | ORAL_TABLET | Freq: Once | ORAL | Status: AC
  Administered 2013-05-26: 650 mg via ORAL
  Filled 2013-05-26: qty 2

## 2013-05-26 MED ORDER — MECLIZINE HCL 12.5 MG PO TABS: 12.5000 mg | ORAL_TABLET | Freq: Three times a day (TID) | ORAL | Status: DC | PRN

## 2013-05-26 MED ORDER — ACETAMINOPHEN 500 MG PO TABS: 500.0000 mg | ORAL_TABLET | Freq: Four times a day (QID) | ORAL | Status: DC | PRN

## 2013-05-26 NOTE — ED Notes (Signed)
Pt alert and mentating appropriately upon d/c. Pt given d/c teaching and prescriptions. Pt verbalizes understanding and has no further questions upon d/c. Pt ambulatory upon d/c. Pts sister at bedside endorses she will be driving pt home. NAD noted upon d/c. Pt leaving with d/c teaching.

## 2013-05-26 NOTE — ED Notes (Signed)
Pt c/o dizziness and nausea with ear pain x 2 days; pt sts "feels like room is spinning"

## 2013-05-26 NOTE — ED Notes (Signed)
Pt ambulated in hall without difficulty; pt states she feels better and does not feel as lightheaded as she did earlier. NAD noted.

## 2013-05-26 NOTE — ED Notes (Signed)
Bowie Tran, PA at bedside 

## 2013-05-26 NOTE — ED Provider Notes (Signed)
History     CSN: [id number]  Arrival date & time 05/26/13  1012   First MD Initiated Contact with Patient 05/26/13 1143      Chief Complaint  Patient presents with  . Dizziness  . Otalgia    (Consider location/radiation/quality/duration/timing/severity/associated sxs/prior treatment) HPI  27 year old female presents complaining of dizziness. Patient reports yesterday morning she woke up and while getting out of bed she felt dizzy. Described as sensation of room spinning. Symptom has been waxing waning throughout the day and continues to persist onto today. Also endorsed nausea without vomiting or diarrhea. She complaining of right ear pain described as an achy sensation for the same duration. Symptoms since 2 worsened with positional change. She reports having double vision for a few minutes yesterday but that has resolved. Patient also complaining of a tingling sensation to her right foot for the past 2 days. Nothing seems to make it better or worse. Patient denies fever, chills, runny nose, sneezing, cough, ringing in ears, sore throat, chest pain, shortness of breath, abdominal pain, back pain, dysuria, or rash. No specific treatment tried. Patient recently finished her menstruation 2 days ago. No significant family history of cancers or MS.  History reviewed. No pertinent past medical history.  History reviewed. No pertinent past surgical history.  Family History  Problem Relation Age of Onset  . Hypertension Mother     History  Substance Use Topics  . Smoking status: Current Every Day Smoker    Types: Cigarettes  . Smokeless tobacco: Not on file  . Alcohol Use: Yes     Comment: 1I beer ocasionally    OB History   Grav Para Term Preterm Abortions TAB SAB Ect Mult Living                  Review of Systems  All other systems reviewed and are negative.    Allergies  Review of patient's allergies indicates no known allergies.  Home Medications  No current  outpatient prescriptions on file.  BP 125/62  Pulse 68  Temp(Src) 97.7 F (36.5 C) (Axillary)  Resp 16  SpO2 100%  LMP 04/23/2013  Physical Exam  Nursing note and vitals reviewed. Constitutional: She is oriented to person, place, and time. She appears well-developed and well-nourished. No distress.  HENT:  Head: Atraumatic.  Ear: R ear canal with cerumen impaction.  L TM normal  Eyes: Conjunctivae are normal.  fatigue able horizontal nystagmus  Neck: Neck supple.  Cardiovascular: Normal rate, regular rhythm and intact distal pulses.   Pulmonary/Chest: Effort normal and breath sounds normal. No respiratory distress.  Abdominal: Soft. There is no tenderness.  Musculoskeletal: She exhibits no edema.  Neurological: She is alert and oriented to person, place, and time.  Romberg negative  Normal gait  Subjective paresthesia to dorsum btween 1st-2nd toe.  Patella DTR 2+ bilaterally, no foot drop, brisk cap refills, palpable DP  Skin: Skin is warm. No rash noted.  Psychiatric: She has a normal mood and affect.    ED Course  Procedures (including critical care time)  1:04 PM Pt c/o vertigo, likely BPPV. Doubt central vertigo.  Meclizine given.  Care discussed with attending.    2:58 PM Patient felt much better after receiving meclizine. Endorse mild throbbing headache.  Tylenol given.  Will ambulate pt.    3:15 PM Patient is able to ambulate without difficulty. She felt much better. She is stable for discharge. Return precautions discussed. A list of resource given.  Labs Reviewed  COMPREHENSIVE METABOLIC PANEL - Abnormal; Notable for the following:    Glucose, Bld 111 (*)    Total Bilirubin 0.2 (*)    All other components within normal limits  CBC WITH DIFFERENTIAL  POCT PREGNANCY, URINE   No results found.   1. Dizziness   2. BPPV (benign paroxysmal positional vertigo)       MDM  BP 109/72  Pulse 61  Temp(Src) 98.2 F (36.8 C) (Oral)  Resp 16  SpO2 100%   LMP 04/23/2013  I have reviewed nursing notes and vital signs.  I reviewed available ER/hospitalization records thought the EMR         Fayrene Helper, [place] 05/26/13 1516

## 2013-05-27 NOTE — ED Provider Notes (Signed)
Medical screening examination/treatment/procedure(s) were performed by non-physician practitioner and as supervising physician I was immediately available for consultation/collaboration.   Rondale Nies Y. Albeiro Trompeter, MD 05/27/13 1035 

## 2013-08-28 ENCOUNTER — Emergency Department (HOSPITAL_COMMUNITY)
Admission: EM | Admit: 2013-08-28 | Discharge: 2013-08-28 | Disposition: A | Discharge status: Home / Self Care | Payer: Self-pay | Attending: Emergency Medicine | Admitting: Emergency Medicine

## 2013-08-28 ENCOUNTER — Encounter (HOSPITAL_COMMUNITY): Payer: Self-pay

## 2013-08-28 DIAGNOSIS — Z79899 Other long term (current) drug therapy: Secondary | ICD-10-CM | POA: Insufficient documentation

## 2013-08-28 DIAGNOSIS — F17200 Nicotine dependence, unspecified, uncomplicated: Secondary | ICD-10-CM | POA: Insufficient documentation

## 2013-08-28 DIAGNOSIS — K0889 Other specified disorders of teeth and supporting structures: Secondary | ICD-10-CM

## 2013-08-28 DIAGNOSIS — K089 Disorder of teeth and supporting structures, unspecified: Secondary | ICD-10-CM | POA: Insufficient documentation

## 2013-08-28 MED ORDER — AMOXICILLIN 500 MG PO CAPS: 500.0000 mg | ORAL_CAPSULE | Freq: Three times a day (TID) | ORAL | Status: DC

## 2013-08-28 MED ORDER — OXYCODONE-ACETAMINOPHEN 5-325 MG PO TABS: 2.0000 | ORAL_TABLET | ORAL | Status: DC | PRN

## 2013-08-28 NOTE — ED Notes (Signed)
Pt c/o toothache x 2 1/2 weeks.

## 2013-08-28 NOTE — ED Notes (Signed)
nad noted. Dc instructions reviewed and explained. 2 scripts given

## 2013-08-28 NOTE — ED Provider Notes (Signed)
CSN: [id number]     Arrival date & time 08/28/13  4098 History  This chart was scribed for Donnetta Hutching, MD by Quintella Reichert, ED scribe.  This patient was seen in room [facility]12[facility] and the patient's care was started at 7:25 AM.  Chief Complaint  Patient presents with  . Dental Pain    The history is provided by the patient. No language interpreter was used.    HPI Comments: Bonnie Yang is a 27 y.o. female who presents to the Emergency Department complaining of progressively-worsening moderate left lower dental pain that began 2 weeks ago but became most severe last night.  Pt describes pain as throbbing and exacerbated by eating.  She denies any injuries or cavities to her knowledge but does note that she has a wisdom tooth coming in in that area.  She denies fevers, vomiting, or any other associated symptoms.  Pt does not have a dentist.   History reviewed. No pertinent past medical history.   History reviewed. No pertinent past surgical history.   Family History  Problem Relation Age of Onset  . Hypertension Mother     History  Substance Use Topics  . Smoking status: Current Every Day Smoker    Types: Cigarettes  . Smokeless tobacco: Not on file  . Alcohol Use: Yes     Comment: 1I beer ocasionally    OB History   Grav Para Term Preterm Abortions TAB SAB Ect Mult Living                  Review of Systems A complete 10 system review of systems was obtained and all systems are negative except as noted in the HPI and PMH.    Allergies  Review of patient's allergies indicates no known allergies.  Home Medications   Current Outpatient Rx  Name  Route  Sig  Dispense  Refill  . acetaminophen (TYLENOL) 500 MG tablet   Oral   Take 1 tablet (500 mg total) by mouth every 6 (six) hours as needed for pain.   30 tablet   0   . Artificial Tear Ointment (DRY EYES OP)   Ophthalmic   Apply 1 drop to eye daily as needed (dry eyes).         Marland Kitchen HYDROCODONE-ACETAMINOPHEN  PO   Oral   Take 1 tablet by mouth once.         . meclizine (ANTIVERT) 12.5 MG tablet   Oral   Take 1 tablet (12.5 mg total) by mouth 3 (three) times daily as needed for dizziness or nausea.   20 tablet   0    BP 116/70  Pulse 104  Temp(Src) 98.4 F (36.9 C) (Oral)  Resp 20  Ht 5\' 5"  (1.651 m)  Wt 105 lb (47.628 kg)  BMI 17.47 kg/m2  SpO2 100%  Physical Exam  Nursing note and vitals reviewed. Constitutional: She is oriented to person, place, and time. She appears well-developed and well-nourished.  HENT:  Head: Normocephalic and atraumatic.  Mouth/Throat: Oropharynx is clear and moist. No oropharyngeal exudate.  Left lower molar has gingival overgrowth  Eyes: Conjunctivae are normal.  Neck: Normal range of motion. Neck supple.  Cardiovascular: Normal rate.   Pulmonary/Chest: Effort normal. No respiratory distress.  Musculoskeletal: Normal range of motion.  Neurological: She is alert and oriented to person, place, and time.  Skin: Skin is warm and dry.  Psychiatric: She has a normal mood and affect.    ED Course  Procedures (  including critical care time)  DIAGNOSTIC STUDIES: Oxygen Saturation is 100% on room air, normal by my interpretation.    COORDINATION OF CARE: 7:29 AM-Discussed treatment plan which includes antibiotics, pain medication and dental referral with pt at bedside and pt agreed to plan.    Labs Review Labs Reviewed - No data to display  Imaging Review No results found.  MDM  No diagnosis found. Uncomplicated tooth and gingival pain.   Rx amoxicillin for Percocet. Referral to dentist   I personally performed the services described in this documentation, which was scribed in my presence. The recorded information has been reviewed and is accurate.  0  Donnetta Hutching, MD 08/28/13 [phone]

## 2013-09-04 ENCOUNTER — Emergency Department (HOSPITAL_COMMUNITY)
Admission: EM | Admit: 2013-09-04 | Discharge: 2013-09-05 | Disposition: A | Discharge status: Home / Self Care | Payer: No Typology Code available for payment source | Attending: Emergency Medicine | Admitting: Emergency Medicine

## 2013-09-04 ENCOUNTER — Emergency Department (HOSPITAL_COMMUNITY): Payer: No Typology Code available for payment source

## 2013-09-04 ENCOUNTER — Encounter (HOSPITAL_COMMUNITY): Payer: Self-pay | Admitting: *Deleted

## 2013-09-04 DIAGNOSIS — Y92410 Unspecified street and highway as the place of occurrence of the external cause: Secondary | ICD-10-CM | POA: Insufficient documentation

## 2013-09-04 DIAGNOSIS — S46909A Unspecified injury of unspecified muscle, fascia and tendon at shoulder and upper arm level, unspecified arm, initial encounter: Secondary | ICD-10-CM | POA: Insufficient documentation

## 2013-09-04 DIAGNOSIS — S4980XA Other specified injuries of shoulder and upper arm, unspecified arm, initial encounter: Secondary | ICD-10-CM | POA: Insufficient documentation

## 2013-09-04 DIAGNOSIS — T07XXXA Unspecified multiple injuries, initial encounter: Secondary | ICD-10-CM | POA: Insufficient documentation

## 2013-09-04 DIAGNOSIS — Z792 Long term (current) use of antibiotics: Secondary | ICD-10-CM | POA: Insufficient documentation

## 2013-09-04 DIAGNOSIS — S79919A Unspecified injury of unspecified hip, initial encounter: Secondary | ICD-10-CM | POA: Insufficient documentation

## 2013-09-04 DIAGNOSIS — Z3202 Encounter for pregnancy test, result negative: Secondary | ICD-10-CM | POA: Insufficient documentation

## 2013-09-04 DIAGNOSIS — IMO0002 Reserved for concepts with insufficient information to code with codable children: Secondary | ICD-10-CM | POA: Insufficient documentation

## 2013-09-04 DIAGNOSIS — S298XXA Other specified injuries of thorax, initial encounter: Secondary | ICD-10-CM | POA: Insufficient documentation

## 2013-09-04 DIAGNOSIS — F17200 Nicotine dependence, unspecified, uncomplicated: Secondary | ICD-10-CM | POA: Insufficient documentation

## 2013-09-04 DIAGNOSIS — Y9389 Activity, other specified: Secondary | ICD-10-CM | POA: Insufficient documentation

## 2013-09-04 LAB — CBC WITH DIFFERENTIAL/PLATELET
Eosinophils Absolute: 0 10*3/uL (ref 0.0–0.7)
Hemoglobin: 14.6 g/dL (ref 12.0–15.0)
Lymphs Abs: 1.4 10*3/uL (ref 0.7–4.0)
MCH: 29.3 pg (ref 26.0–34.0)
Monocytes Relative: 12 % (ref 3–12)
Neutro Abs: 2.7 10*3/uL (ref 1.7–7.7)
Neutrophils Relative %: 57 % (ref 43–77)
RBC: 4.98 MIL/uL (ref 3.87–5.11)

## 2013-09-04 LAB — BASIC METABOLIC PANEL
BUN: 11 mg/dL (ref 6–23)
Chloride: 98 mEq/L (ref 96–112)
Glucose, Bld: 100 mg/dL — ABNORMAL HIGH (ref 70–99)
Potassium: 3.3 mEq/L — ABNORMAL LOW (ref 3.5–5.1)

## 2013-09-04 LAB — URINE MICROSCOPIC-ADD ON

## 2013-09-04 LAB — URINALYSIS, ROUTINE W REFLEX MICROSCOPIC
Glucose, UA: NEGATIVE mg/dL
Leukocytes, UA: NEGATIVE
pH: 6 (ref 5.0–8.0)

## 2013-09-04 MED ORDER — IOHEXOL 300 MG/ML  SOLN
100.0000 mL | Freq: Once | INTRAMUSCULAR | Status: AC | PRN
  Administered 2013-09-04: 100 mL via INTRAVENOUS

## 2013-09-04 MED ORDER — HYDROCODONE-ACETAMINOPHEN 5-325 MG PO TABS: 2.0000 | ORAL_TABLET | Freq: Once | ORAL | Status: DC

## 2013-09-04 MED ORDER — MORPHINE SULFATE 4 MG/ML IJ SOLN
4.0000 mg | Freq: Once | INTRAMUSCULAR | Status: AC
  Administered 2013-09-04: 4 mg via INTRAVENOUS
  Filled 2013-09-04: qty 1

## 2013-09-04 MED ORDER — ONDANSETRON HCL 4 MG/2ML IJ SOLN
4.0000 mg | Freq: Once | INTRAMUSCULAR | Status: AC
  Administered 2013-09-04: 4 mg via INTRAVENOUS
  Filled 2013-09-04: qty 2

## 2013-09-04 MED ORDER — IBUPROFEN 800 MG PO TABS: 800.0000 mg | ORAL_TABLET | Freq: Three times a day (TID) | ORAL | Status: DC

## 2013-09-04 MED ORDER — HYDROCODONE-ACETAMINOPHEN 5-325 MG PO TABS: 2.0000 | ORAL_TABLET | ORAL | Status: DC | PRN
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
## 2013-09-06 NOTE — Progress Notes (Signed)
Received call from [facility] to verify Patients Amoxacillin script.Medication / date of service verified with Pharmacist.

## 2014-02-16 ENCOUNTER — Emergency Department (HOSPITAL_COMMUNITY)
Admission: EM | Admit: 2014-02-16 | Discharge: 2014-02-16 | Disposition: A | Discharge status: Home / Self Care | Payer: No Typology Code available for payment source | Attending: Emergency Medicine | Admitting: Emergency Medicine

## 2014-02-16 ENCOUNTER — Encounter (HOSPITAL_COMMUNITY): Payer: Self-pay | Admitting: Emergency Medicine

## 2014-02-16 DIAGNOSIS — K0889 Other specified disorders of teeth and supporting structures: Secondary | ICD-10-CM

## 2014-02-16 DIAGNOSIS — K089 Disorder of teeth and supporting structures, unspecified: Secondary | ICD-10-CM | POA: Insufficient documentation

## 2014-02-16 DIAGNOSIS — F17200 Nicotine dependence, unspecified, uncomplicated: Secondary | ICD-10-CM | POA: Insufficient documentation

## 2014-02-16 MED ORDER — NAPROXEN 250 MG PO TABS
500.0000 mg | ORAL_TABLET | Freq: Once | ORAL | Status: AC
  Administered 2014-02-16: 500 mg via ORAL
  Filled 2014-02-16: qty 2

## 2014-02-16 MED ORDER — PENICILLIN V POTASSIUM 500 MG PO TABS: 500.0000 mg | ORAL_TABLET | Freq: Four times a day (QID) | ORAL | Status: AC

## 2014-02-16 NOTE — ED Notes (Signed)
Pt c/o right upper dental pain. Pt reports "bad taste" in mouth.

## 2014-02-16 NOTE — ED Provider Notes (Signed)
CSN: [id number]     Arrival date & time 02/16/14  0747 History   First MD Initiated Contact with Patient 02/16/14 0800     Chief Complaint  Patient presents with  . Dental Pain     Patient is a 28 y.o. female presenting with tooth pain. The history is provided by the patient.  Dental Pain Location:  Upper Severity:  Moderate Onset quality:  Gradual Duration:  1 day Timing:  Constant Progression:  Worsening Chronicity:  New Relieved by:  Nothing Associated symptoms: no fever     PMH - none  History reviewed. No pertinent past surgical history. Family History  Problem Relation Age of Onset  . Hypertension Mother    History  Substance Use Topics  . Smoking status: Current Every Day Smoker    Types: Cigarettes  . Smokeless tobacco: Not on file  . Alcohol Use: Yes     Comment: 1I beer ocasionally   OB History   Grav Para Term Preterm Abortions TAB SAB Ect Mult Living                 Review of Systems  Constitutional: Negative for fever.  Gastrointestinal: Negative for vomiting.      Allergies  Review of patient's allergies indicates no known allergies.  Home Medications   Current Outpatient Rx  Name  Route  Sig  Dispense  Refill  . acetaminophen (TYLENOL) 500 MG tablet   Oral   Take 1 tablet (500 mg total) by mouth every 6 (six) hours as needed for pain.   30 tablet   0   . Artificial Tear Ointment (DRY EYES OP)   Ophthalmic   Apply 1 drop to eye daily as needed (dry eyes).         Marland Kitchen. ibuprofen (ADVIL,MOTRIN) 800 MG tablet   Oral   Take 1 tablet (800 mg total) by mouth 3 (three) times daily.   21 tablet   0   . medroxyPROGESTERone (DEPO-PROVERA) 150 MG/ML injection   Intramuscular   Inject 150 mg into the muscle every 3 (three) months.         . penicillin v potassium (VEETID) 500 MG tablet   Oral   Take 1 tablet (500 mg total) by mouth 4 (four) times daily.   40 tablet   0    BP 120/64  Pulse 98  Temp(Src) 98.7 F (37.1 C) (Oral)   Resp 18  SpO2 100% Physical Exam CONSTITUTIONAL: Well developed/well nourished HEAD AND FACE: Normocephalic/atraumatic EYES: EOMI/PERRL ENMT: Mucous membranes moist.  Poor dentition.  No trismus.  No focal abscess noted. NECK: supple no meningeal signs ABDOMEN: soft, nontender, no rebound or guarding NEURO: Pt is awake/alert, moves all extremitiesx4 EXTREMITIES:full ROM SKIN: warm, color normal  ED Course  Procedures   MDM   Final diagnoses:  Pain, dental    Nursing notes including past medical history and social history reviewed and considered in documentation     Joya Gaskinsonald W Bianka Liberati, MD 02/16/14 [phone]

## 2014-02-16 NOTE — Discharge Instructions (Signed)
Dental Pain °Toothache is pain in or around a tooth. It may get worse with chewing or with cold or heat.  °HOME CARE °· Your dentist may use a numbing medicine during treatment. If so, you may need to avoid eating until the medicine wears off. Ask your dentist about this. °· Only take medicine as told by your dentist or doctor. °· Avoid chewing food near the painful tooth until after all treatment is done. Ask your dentist about this. °GET HELP RIGHT AWAY IF:  °· The problem gets worse or new problems appear. °· You have a fever. °· There is redness and puffiness (swelling) of the face, jaw, or neck. °· You cannot open your mouth. °· There is pain in the jaw. °· There is very bad pain that is not helped by medicine. °MAKE SURE YOU:  °· Understand these instructions. °· Will watch your condition. °· Will get help right away if you are not doing well or get worse. °Document Released: 05/08/2008 Document Revised: 02/12/2012 Document Reviewed: 05/08/2008 °[employer]® Patient Information ©2014 [employer]. ° °

## 2015-04-02 ENCOUNTER — Inpatient Hospital Stay (HOSPITAL_COMMUNITY)
Admission: EM | Admit: 2015-04-02 | Discharge: 2015-04-04 | DRG: 602 | Disposition: A | Discharge status: Home / Self Care | Payer: Self-pay | Attending: Internal Medicine | Admitting: Internal Medicine

## 2015-04-02 ENCOUNTER — Encounter (HOSPITAL_COMMUNITY): Payer: Self-pay | Admitting: *Deleted

## 2015-04-02 ENCOUNTER — Emergency Department (HOSPITAL_COMMUNITY): Payer: Self-pay

## 2015-04-02 DIAGNOSIS — J340 Abscess, furuncle and carbuncle of nose: Secondary | ICD-10-CM | POA: Diagnosis present

## 2015-04-02 DIAGNOSIS — F17210 Nicotine dependence, cigarettes, uncomplicated: Secondary | ICD-10-CM | POA: Diagnosis present

## 2015-04-02 DIAGNOSIS — L03211 Cellulitis of face: Principal | ICD-10-CM | POA: Diagnosis present

## 2015-04-02 DIAGNOSIS — Z681 Body mass index (BMI) 19 or less, adult: Secondary | ICD-10-CM

## 2015-04-02 DIAGNOSIS — E43 Unspecified severe protein-calorie malnutrition: Secondary | ICD-10-CM | POA: Diagnosis present

## 2015-04-02 LAB — BASIC METABOLIC PANEL
ANION GAP: 12 (ref 5–15)
BUN: 9 mg/dL (ref 6–23)
CALCIUM: 9.5 mg/dL (ref 8.4–10.5)
CO2: 23 mmol/L (ref 19–32)
CREATININE: 0.7 mg/dL (ref 0.50–1.10)
Chloride: 102 mmol/L (ref 96–112)
GFR calc Af Amer: >90 mL/min (ref >90)
GLUCOSE: 86 mg/dL (ref 70–99)
Potassium: 3.5 mmol/L (ref 3.5–5.1)
Sodium: 137 mmol/L (ref 135–145)

## 2015-04-02 LAB — CBC WITH DIFFERENTIAL/PLATELET
Basophils Absolute: 0 10*3/uL (ref 0.0–0.1)
Basophils Relative: 0 % (ref 0–1)
Eosinophils Absolute: 0.1 10*3/uL (ref 0.0–0.7)
Eosinophils Relative: 1 % (ref 0–5)
HEMATOCRIT: 42.7 % (ref 36.0–46.0)
HEMOGLOBIN: 14.4 g/dL (ref 12.0–15.0)
LYMPHS ABS: 1.8 10*3/uL (ref 0.7–4.0)
Lymphocytes Relative: 17 % (ref 12–46)
MCH: 29.7 pg (ref 26.0–34.0)
MCHC: 33.7 g/dL (ref 30.0–36.0)
MCV: 88 fL (ref 78.0–100.0)
MONO ABS: 1.2 10*3/uL — AB (ref 0.1–1.0)
Monocytes Relative: 12 % (ref 3–12)
Neutro Abs: 7.3 10*3/uL (ref 1.7–7.7)
Neutrophils Relative %: 70 % (ref 43–77)
PLATELETS: 182 10*3/uL (ref 150–400)
RBC: 4.85 MIL/uL (ref 3.87–5.11)
RDW: 13.6 % (ref 11.5–15.5)
WBC: 10.4 10*3/uL (ref 4.0–10.5)

## 2015-04-02 LAB — PREGNANCY, URINE: PREG TEST UR: NEGATIVE

## 2015-04-02 LAB — I-STAT CG4 LACTIC ACID, ED: LACTIC ACID, VENOUS: 1.22 mmol/L (ref 0.5–2.0)

## 2015-04-02 LAB — I-STAT BETA HCG BLOOD, ED (MC, WL, AP ONLY): I-stat hCG, quantitative: <5 m[IU]/mL (ref <5)

## 2015-04-02 MED ORDER — IOHEXOL 300 MG/ML  SOLN
75.0000 mL | Freq: Once | INTRAMUSCULAR | Status: AC | PRN
  Administered 2015-04-02: 75 mL via INTRAVENOUS

## 2015-04-02 MED ORDER — MORPHINE SULFATE 4 MG/ML IJ SOLN
4.0000 mg | INTRAMUSCULAR | Status: AC | PRN
  Administered 2015-04-03 (×2): 4 mg via INTRAVENOUS
  Filled 2015-04-02 (×2): qty 1

## 2015-04-02 MED ORDER — SODIUM CHLORIDE 0.9 % IV SOLN
INTRAVENOUS | Status: DC
  Administered 2015-04-02 – 2015-04-03 (×3): via INTRAVENOUS

## 2015-04-02 MED ORDER — HEPARIN SODIUM (PORCINE) 5000 UNIT/ML IJ SOLN
5000.0000 [IU] | Freq: Three times a day (TID) | INTRAMUSCULAR | Status: DC
  Administered 2015-04-02 – 2015-04-04 (×3): 5000 [IU] via SUBCUTANEOUS
  Filled 2015-04-02 (×4): qty 1

## 2015-04-02 MED ORDER — CLINDAMYCIN PHOSPHATE 900 MG/50ML IV SOLN
900.0000 mg | Freq: Once | INTRAVENOUS | Status: AC
  Administered 2015-04-02: 900 mg via INTRAVENOUS
  Filled 2015-04-02: qty 50

## 2015-04-02 MED ORDER — HYDROCODONE-ACETAMINOPHEN 5-325 MG PO TABS
1.0000 | ORAL_TABLET | ORAL | Status: DC | PRN
  Administered 2015-04-03 (×3): 2 via ORAL
  Administered 2015-04-03: 1 via ORAL
  Administered 2015-04-04 (×3): 2 via ORAL
  Filled 2015-04-02: qty 1
  Filled 2015-04-02 (×6): qty 2

## 2015-04-02 MED ORDER — MORPHINE SULFATE 4 MG/ML IJ SOLN
4.0000 mg | INTRAMUSCULAR | Status: AC | PRN
  Administered 2015-04-02 (×2): 4 mg via INTRAVENOUS
  Filled 2015-04-02 (×2): qty 1

## 2015-04-02 MED ORDER — ONDANSETRON HCL 4 MG PO TABS: 4.0000 mg | ORAL_TABLET | Freq: Four times a day (QID) | ORAL | Status: DC | PRN

## 2015-04-02 MED ORDER — SODIUM CHLORIDE 0.9 % IV SOLN
1.5000 g | Freq: Four times a day (QID) | INTRAVENOUS | Status: DC
  Administered 2015-04-03 – 2015-04-04 (×6): 1.5 g via INTRAVENOUS
  Filled 2015-04-02 (×10): qty 1.5

## 2015-04-02 MED ORDER — ONDANSETRON HCL 4 MG/2ML IJ SOLN: 4.0000 mg | Freq: Four times a day (QID) | INTRAMUSCULAR | Status: DC | PRN

## 2015-04-02 NOTE — H&P (Signed)
[facility] History and Physical  Bonnie PicklesVeronica L Yang JYN:[id number]:[id number] DOB: 04/07/1986    PCP:   No PCP Per Patient   Chief Complaint: Swelling of the left nostril.   HPI: Bonnie Yang is an 29 y.o. female with no prior significant PMH on no chronic medication presented to the ER with swelling and pain of her left face and left nostril.  She had a pimple on the inside, and it has gotten worse.  She denied having fever, chills, HA, Vx changes, diplopia, or systemic symptoms.  Evaluation in the ER showed normal WBC, normal Cr, and head CT showed a small abscess of the left nostril measuring 1.4cm by 0.9 cm.  She denied drug use, smoke cigarettes and drinks occionally.  EDP consulted Dr Jenne PaneBates of ENT, and he will see her in the morning.  She was given IV Clindamycin in the ER.  Hospitalist was asked to admit her for same.   Rewiew of Systems:  Constitutional: Negative for malaise, fever and chills. No significant weight loss or weight gain Eyes: Negative for eye pain, redness and discharge, diplopia, visual changes, or flashes of light. ENMT: Negative for ear pain, hoarseness, nasal congestion, sinus pressure and sore throat. No headaches; tinnitus, drooling, or problem swallowing. Cardiovascular: Negative for chest pain, palpitations, diaphoresis, dyspnea and peripheral edema. ; No orthopnea, PND Respiratory: Negative for cough, hemoptysis, wheezing and stridor. No pleuritic chestpain. Gastrointestinal: Negative for nausea, vomiting, diarrhea, constipation, abdominal pain, melena, blood in stool, hematemesis, jaundice and rectal bleeding.    Genitourinary: Negative for frequency, dysuria, incontinence,flank pain and hematuria; Musculoskeletal: Negative for back pain and neck pain. Negative for swelling and trauma.;  Skin: . Negative for pruritus, rash, abrasions, bruising and skin lesion.; ulcerations Neuro: Negative for headache, lightheadedness and neck stiffness. Negative for weakness, altered  level of consciousness , altered mental status, extremity weakness, burning feet, involuntary movement, seizure and syncope.  Psych: negative for anxiety, depression, insomnia, tearfulness, panic attacks, hallucinations, paranoia, suicidal or homicidal ideation    PMH is benign.  Medications:  HOME MEDS: Prior to Admission medications   Medication Sig Start Date End Date Taking? Authorizing Provider  Multiple Vitamin (MULTIVITAMIN WITH MINERALS) TABS tablet Take 1 tablet by mouth daily.   Yes Historical Provider, MD  acetaminophen (TYLENOL) 500 MG tablet Take 1 tablet (500 mg total) by mouth every 6 (six) hours as needed for pain. Patient not taking: Reported on 04/02/2015 05/26/13   Fayrene HelperBowie Tran, PA-C  ibuprofen (ADVIL,MOTRIN) 800 MG tablet Take 1 tablet (800 mg total) by mouth 3 (three) times daily. Patient not taking: Reported on 04/02/2015 09/04/13   Glynn OctaveStephen Rancour, MD     Allergies:  No Known Allergies  Social History:   reports that she has been smoking Cigarettes.  She does not have any smokeless tobacco history on file. She reports that she drinks alcohol. She reports that she does not use illicit drugs.  Family History: Family History  Problem Relation Age of Onset  . Hypertension Mother      Physical Exam: Filed Vitals:   04/02/15 1703 04/02/15 2019  BP: 132/89 116/73  Pulse: 117 81  Temp: 98.8 F (37.1 C)   TempSrc: Oral   Resp: 15 16  Height: 5\' 5"  (1.651 m)   Weight: 49.896 kg (110 lb)   SpO2: 100% 99%   Blood pressure 116/73, pulse 81, temperature 98.8 F (37.1 C), temperature source Oral, resp. rate 16, height 5\' 5"  (1.651 m), weight 49.896 kg (110 lb), last  menstrual period 03/23/2015, SpO2 99 %.  GEN:  Pleasant  patient lying in the stretcher in no acute distress; cooperative with exam. PSYCH:  alert and oriented x4; does not appear anxious or depressed; affect is appropriate. HEENT: Mucous membranes pink and anicteric; PERRLA; EOM intact; no cervical  lymphadenopathy nor thyromegaly or carotid bruit; no JVD; There were no stridor. Neck is very supple. Breasts:: Not examined CHEST WALL: No tenderness CHEST: Normal respiration, clear to auscultation bilaterally.  HEART: Regular rate and rhythm.  There are no murmur, rub, or gallops.   BACK: No kyphosis or scoliosis; no CVA tenderness ABDOMEN: soft and non-tender; no masses, no organomegaly, normal abdominal bowel sounds; no pannus; no intertriginous candida. There is no rebound and no distention. Rectal Exam: Not done EXTREMITIES: No bone or joint deformity; age-appropriate arthropathy of the hands and knees; no edema; no ulcerations.  There is no calf tenderness. Genitalia: not examined PULSES: 2+ and symmetric SKIN: Normal hydration no rash or ulceration CNS: Cranial nerves 2-12 grossly intact no focal lateralizing neurologic deficit.  Speech is fluent; uvula elevated with phonation, facial symmetry and tongue midline. DTR are normal bilaterally, cerebella exam is intact, barbinski is negative and strengths are equaled bilaterally.  No sensory loss.   Labs on Admission:  Basic Metabolic Panel:  Recent Labs Lab 04/02/15 1806  NA 137  K 3.5  CL 102  CO2 23  GLUCOSE 86  BUN 9  CREATININE 0.70  CALCIUM 9.5   CBC:  Recent Labs Lab 04/02/15 1806  WBC 10.4  NEUTROABS 7.3  HGB 14.4  HCT 42.7  MCV 88.0  PLT 182   Radiological Exams on Admission: Ct Maxillofacial W/cm  04/02/2015   CLINICAL DATA:  Enlarging bump on nose, left face swelling, began 03/29/2015  EXAM: CT MAXILLOFACIAL WITH CONTRAST  TECHNIQUE: Multidetector CT imaging of the maxillofacial structures was performed with intravenous contrast. Multiplanar CT image reconstructions were also generated. A small metallic BB was placed on the right temple in order to reliably differentiate right from left.  CONTRAST:  75mL OMNIPAQUE IOHEXOL 300 MG/ML  SOLN  COMPARISON:  None.  FINDINGS: In the subcutaneous soft tissues of the  left nose there is a low-attenuation rim enhancing oval abnormality measuring 9 x 14 mm. This causes mass effect on the nares. This is associated with increased subcutaneous attenuation of the nose rather diffusely and extending over the left maxillary sinus. Bilateral cervical chain adenopathy, with the largest lymph node on the left measuring 12 mm in short axis. No other abnormalities identified.  IMPRESSION: Evidence of soft tissue abscess was with associated tension cellulitis.   Electronically Signed   By: Esperanza Heir M.D.   On: 04/02/2015 20:27    Assessment/Plan Present on Admission:  . Facial cellulitis  PLAN:  Will admit her to [facility] for ENT consultation tomorrow.  I will give IV Unasyn to add GN coverage.  She will be made NPO after midnight.  She is stable, full code, and will be admitted to [facility].  Will obtain a urine pregnancy test as well.  Thanks !  Other plans as per orders.  Code Status: FULL Unk Lightning, MD. [facility] Pager [phone] 7pm to 7am.  04/02/2015, 9:07 PM

## 2015-04-02 NOTE — ED Provider Notes (Signed)
CSN: [id number]     Arrival date & time 04/02/15  1655 History   First MD Initiated Contact with Patient 04/02/15 1737     Chief Complaint  Patient presents with  . Facial Swelling    left side      HPI  Pt was seen at 1745. Per pt, c/o gradual onset and worsening of persistent nose "rash" for the past 4 days. Pt states she "felt a bump" inside her left nares 4 days ago. States the "bump" has "gotten bigger" and "now my whole nose is red and swollen and hurts." States she "keeps swallowing pus or something" in "the back of my throat." Endorses home fevers to "101." Denies rhinorrhea, no purulent drainage or bleeding from anterior nares. No facial wounds, no injury.     History reviewed. No pertinent past medical history.   History reviewed. No pertinent past surgical history.   Family History  Problem Relation Age of Onset  . Hypertension Mother    History  Substance Use Topics  . Smoking status: Current Every Day Smoker    Types: Cigarettes  . Smokeless tobacco: Not on file  . Alcohol Use: Yes     Comment: 1I beer ocasionally    Review of Systems ROS: Statement: All systems negative except as marked or noted in the HPI; Constitutional: +fever and chills. ; ; Eyes: Negative for eye pain, redness and discharge. ; ; ENMT: Negative for ear pain, hoarseness, nasal congestion, sinus pressure and sore throat. ; ; Cardiovascular: Negative for chest pain, palpitations, diaphoresis, dyspnea and peripheral edema. ; ; Respiratory: Negative for cough, wheezing and stridor. ; ; Gastrointestinal: Negative for nausea, vomiting, diarrhea, abdominal pain, blood in stool, hematemesis, jaundice and rectal bleeding. . ; ; Genitourinary: Negative for dysuria, flank pain and hematuria. ; ; Musculoskeletal: Negative for back pain and neck pain. Negative for swelling and trauma.; ; Skin: Negative for pruritus, abrasions, blisters, bruising and +skin lesion.; ; Neuro: Negative for headache, lightheadedness  and neck stiffness. Negative for weakness, altered level of consciousness , altered mental status, extremity weakness, paresthesias, involuntary movement, seizure and syncope.     Allergies  Review of patient's allergies indicates no known allergies.  Home Medications   Prior to Admission medications   Medication Sig Start Date End Date Taking? Authorizing Provider  Multiple Vitamin (MULTIVITAMIN WITH MINERALS) TABS tablet Take 1 tablet by mouth daily.   Yes Historical Provider, MD  acetaminophen (TYLENOL) 500 MG tablet Take 1 tablet (500 mg total) by mouth every 6 (six) hours as needed for pain. Patient not taking: Reported on 04/02/2015 05/26/13   Fayrene Helper, PA-C  ibuprofen (ADVIL,MOTRIN) 800 MG tablet Take 1 tablet (800 mg total) by mouth 3 (three) times daily. Patient not taking: Reported on 04/02/2015 09/04/13   Glynn Octave, MD   BP 132/89 mmHg  Pulse 117  Temp(Src) 98.8 F (37.1 C) (Oral)  Resp 15  Ht  (1.651 m)  Wt 110 lb (49.896 kg)  BMI 18.31 kg/m2  SpO2 100%  LMP 03/23/2015 Physical Exam  1750; Physical examination:  Nursing notes reviewed; Vital signs and O2 SAT reviewed;  Constitutional: Well developed, Well nourished, Well hydrated, Uncomfortable appearing. Tearful. ; Head:  Normocephalic, atraumatic. +edema and erythema to entire nose, esp left side, tracking to left zygoma and towards medial left eyebrow. No open wounds, no fluctuance, no soft tissue crepitus, no ecchymosis.;; Eyes: EOMI, PERRL, No scleral icterus; ENMT: TM's clear bilat. +visualized edematous area inside upper-lateral left nares tracking  backwards, very TTP. No open wounds, no drainage. No bleeding. No septal hematoma. Mouth and pharynx normal, Mucous membranes moist; Neck: Supple, Full range of motion, No lymphadenopathy; Cardiovascular: Regular rate and rhythm, No murmur, rub, or gallop; Respiratory: Breath sounds clear & equal bilaterally, No rales, rhonchi, wheezes.  Speaking full sentences with  ease, Normal respiratory effort/excursion; Chest: Nontender, Movement normal; Abdomen: Soft, Nontender, Nondistended, Normal bowel sounds; Genitourinary: No CVA tenderness; Extremities: Pulses normal, No tenderness, No edema, No calf edema or asymmetry.; Neuro: AA&Ox3, Major CN grossly intact.  Speech clear. No gross focal motor or sensory deficits in extremities.; Skin: Color normal, Warm, Dry.   ED Course  Procedures     EKG Interpretation None      MDM  MDM Reviewed: previous chart, nursing note and vitals Reviewed previous: labs Interpretation: labs and CT scan     Results for orders placed or performed during the hospital encounter of 04/02/15  CBC with Differential  Result Value Ref Range   WBC 10.4 4.0 - 10.5 K/uL   RBC 4.85 3.87 - 5.11 MIL/uL   Hemoglobin 14.4 12.0 - 15.0 g/dL   HCT 16.1 09.6 - 04.5 %   MCV 88.0 78.0 - 100.0 fL   MCH 29.7 26.0 - 34.0 pg   MCHC 33.7 30.0 - 36.0 g/dL   RDW 40.9 81.1 - 91.4 %   Platelets 182 150 - 400 K/uL   Neutrophils Relative % 70 43 - 77 %   Neutro Abs 7.3 1.7 - 7.7 K/uL   Lymphocytes Relative 17 12 - 46 %   Lymphs Abs 1.8 0.7 - 4.0 K/uL   Monocytes Relative 12 3 - 12 %   Monocytes Absolute 1.2 (H) 0.1 - 1.0 K/uL   Eosinophils Relative 1 0 - 5 %   Eosinophils Absolute 0.1 0.0 - 0.7 K/uL   Basophils Relative 0 0 - 1 %   Basophils Absolute 0.0 0.0 - 0.1 K/uL  Basic metabolic panel  Result Value Ref Range   Sodium 137 135 - 145 mmol/L   Potassium 3.5 3.5 - 5.1 mmol/L   Chloride 102 96 - 112 mmol/L   CO2 23 19 - 32 mmol/L   Glucose, Bld 86 70 - 99 mg/dL   BUN 9 6 - 23 mg/dL   Creatinine, Ser 7.82 0.50 - 1.10 mg/dL   Calcium 9.5 8.4 - 95.6 mg/dL   GFR calc non Af Amer >90 >90 mL/min   GFR calc Af Amer >90 >90 mL/min   Anion gap 12 5 - 15  I-Stat beta hCG blood, ED  Result Value Ref Range   I-stat hCG, quantitative <5.0 <5 mIU/mL   Comment 3          I-Stat CG4 Lactic Acid, ED  Result Value Ref Range   Lactic Acid,  Venous 1.22 0.5 - 2.0 mmol/L   Ct Maxillofacial W/cm 04/02/2015   CLINICAL DATA:  Enlarging bump on nose, left face swelling, began 03/29/2015  EXAM: CT MAXILLOFACIAL WITH CONTRAST  TECHNIQUE: Multidetector CT imaging of the maxillofacial structures was performed with intravenous contrast. Multiplanar CT image reconstructions were also generated. A small metallic BB was placed on the right temple in order to reliably differentiate right from left.  CONTRAST:  75mL OMNIPAQUE IOHEXOL 300 MG/ML  SOLN  COMPARISON:  None.  FINDINGS: In the subcutaneous soft tissues of the left nose there is a low-attenuation rim enhancing oval abnormality measuring 9 x 14 mm. This causes mass effect on the nares. This  is associated with increased subcutaneous attenuation of the nose rather diffusely and extending over the left maxillary sinus. Bilateral cervical chain adenopathy, with the largest lymph node on the left measuring 12 mm in short axis. No other abnormalities identified.  IMPRESSION: Evidence of soft tissue abscess was with associated tension cellulitis.   Electronically Signed   By: Esperanza Heiraymond  Rubner M.D.   On: 04/02/2015 20:27    2040:  IV clindamycin started for facial cellulitis with abscess. Dx and testing d/w pt and family.  Questions answered.  Verb understanding, [facility] Jenne [facility], case discussed, including:  HPI, pertinent PM/SHx, VS/PE, dx testing, ED course and treatment:  Agrees with IV clindamycin, [facility], he will consult tomorrow. [facility] Con[facility], case discussed, including:  HPI, pertinent PM/SHx, VS/PE, dx testing, ED course and treatment:  [facility], will come to ED for evaluation.    Samuel JesterKathleen Tracen Mahler, DO 04/05/15 1502

## 2015-04-02 NOTE — Progress Notes (Signed)
Necklace with small cross and small gauge earrings taken off patient and put into a small ziploc baggie. This was given to the patient while she was still on her stretcher. She did not have any pockets, but the baggie was laid on the stretcher by the patient with the jewelry inside of it.

## 2015-04-02 NOTE — ED Notes (Signed)
Pt states she noticed a bump on her nose on Monday and was treating with OTC meds, today pt states the left side of face is swollen.

## 2015-04-03 LAB — CBC
HCT: 37.1 % (ref 36.0–46.0)
HEMOGLOBIN: 12.4 g/dL (ref 12.0–15.0)
MCH: 28.8 pg (ref 26.0–34.0)
MCHC: 33.4 g/dL (ref 30.0–36.0)
MCV: 86.1 fL (ref 78.0–100.0)
Platelets: 182 10*3/uL (ref 150–400)
RBC: 4.31 MIL/uL (ref 3.87–5.11)
RDW: 13.4 % (ref 11.5–15.5)
WBC: 10.3 10*3/uL (ref 4.0–10.5)

## 2015-04-03 LAB — CREATININE, SERUM: Creatinine, Ser: 0.81 mg/dL (ref 0.50–1.10)

## 2015-04-03 MED ORDER — DIPHENHYDRAMINE HCL 50 MG/ML IJ SOLN
12.5000 mg | Freq: Four times a day (QID) | INTRAMUSCULAR | Status: DC | PRN
  Administered 2015-04-03 – 2015-04-04 (×4): 12.5 mg via INTRAVENOUS
  Filled 2015-04-03 (×4): qty 1

## 2015-04-03 MED ORDER — MORPHINE SULFATE 4 MG/ML IJ SOLN
INTRAMUSCULAR | Status: AC
  Filled 2015-04-03: qty 1

## 2015-04-03 MED ORDER — LIDOCAINE-EPINEPHRINE 1 %-1:100000 IJ SOLN
10.0000 mL | Freq: Once | INTRAMUSCULAR | Status: AC
  Administered 2015-04-03: 10 mL
  Filled 2015-04-03: qty 10

## 2015-04-03 MED ORDER — ENSURE ENLIVE PO LIQD
237.0000 mL | Freq: Three times a day (TID) | ORAL | Status: DC
  Administered 2015-04-03 – 2015-04-04 (×2): 237 mL via ORAL

## 2015-04-03 MED ORDER — MORPHINE SULFATE 4 MG/ML IJ SOLN
4.0000 mg | Freq: Once | INTRAMUSCULAR | Status: AC
Start: 2015-04-03 — End: 2015-04-03
  Administered 2015-04-03: 4 mg via INTRAVENOUS

## 2015-04-03 NOTE — Progress Notes (Signed)
[facility] PROGRESS NOTE  Bonnie Yang NWG:[id number] DOB: 10-04-1986 DOA: 04/02/2015 PCP: No PCP Per Patient  Assessment/Plan: NASAL ABSCESS S/p I&D on IV antibiotics.  Pain control,. ENT consulted and recommendations given.  Cultures are pending.  On IV hydration for 24 hr more.  She has been afebrile and no leukocytosis.  When cellulitis improve. Plan to d/c in am.     Code Status: full code Family Communication: family at bedside Disposition Plan: possibly in 1 to 2 days.    Consultants:  ENT  Procedures:  I&D on 4/30  Antibiotics: Unasyn. 4/30 clindamycin HPI/Subjective: Feel better   Objective: Filed Vitals:   04/03/15 1515  BP: 125/71  Pulse: 89  Temp: 97.9 F (36.6 C)  Resp: 17    Intake/Output Summary (Last 24 hours) at 04/03/15 1846 Last data filed at 04/03/15 1645  Gross per 24 hour  Intake 2611.67 ml  Output      0 ml  Net 2611.67 ml   Filed Weights   04/02/15 1703 04/02/15 2247  Weight: 49.896 kg (110 lb) 44.044 kg (97 lb 1.6 oz)    Exam:   General:  Alert afebrile comfortable  Cardiovascular: s1s2  Respiratory: ctab  Abdomen: soft non tender non distended bowel sounds heard  Musculoskeletal: no pedal edema.   Skin: rash on the back, maculopapular, itching.   Data Reviewed: Basic Metabolic Panel:  Recent Labs Lab 04/02/15 1806 04/03/15 0035  NA 137  --   K 3.5  --   CL 102  --   CO2 23  --   GLUCOSE 86  --   BUN 9  --   CREATININE 0.70 0.81  CALCIUM 9.5  --    Liver Function Tests: No results for input(s): AST, ALT, ALKPHOS, BILITOT, PROT, ALBUMIN in the last 168 hours. No results for input(s): LIPASE, AMYLASE in the last 168 hours. No results for input(s): AMMONIA in the last 168 hours. CBC:  Recent Labs Lab 04/02/15 1806 04/03/15 0035  WBC 10.4 10.3  NEUTROABS 7.3  --   HGB 14.4 12.4  HCT 42.7 37.1  MCV 88.0 86.1  PLT 182 182   Cardiac Enzymes: No results for input(s): CKTOTAL, CKMB,  CKMBINDEX, TROPONINI in the last 168 hours. BNP (last 3 results) No results for input(s): BNP in the last 8760 hours.  ProBNP (last 3 results) No results for input(s): PROBNP in the last 8760 hours.  CBG: No results for input(s): GLUCAP in the last 168 hours.  No results found for this or any previous visit (from the past 240 hour(s)).   Studies: Ct Maxillofacial W/cm  04/02/2015   CLINICAL DATA:  Enlarging bump on nose, left face swelling, began 03/29/2015  EXAM: CT MAXILLOFACIAL WITH CONTRAST  TECHNIQUE: Multidetector CT imaging of the maxillofacial structures was performed with intravenous contrast. Multiplanar CT image reconstructions were also generated. A small metallic BB was placed on the right temple in order to reliably differentiate right from left.  CONTRAST:  75mL OMNIPAQUE IOHEXOL 300 MG/ML  SOLN  COMPARISON:  None.  FINDINGS: In the subcutaneous soft tissues of the left nose there is a low-attenuation rim enhancing oval abnormality measuring 9 x 14 mm. This causes mass effect on the nares. This is associated with increased subcutaneous attenuation of the nose rather diffusely and extending over the left maxillary sinus. Bilateral cervical chain adenopathy, with the largest lymph node on the left measuring 12 mm in short axis. No other abnormalities identified.  IMPRESSION: Evidence of soft  tissue abscess was with associated tension cellulitis.   Electronically Signed   By: Esperanza Heiraymond  Rubner M.D.   On: 04/02/2015 20:27    Scheduled Meds: . ampicillin-sulbactam (UNASYN) IV  1.5 g Intravenous Q6H  . feeding supplement (ENSURE ENLIVE)  237 mL Oral TID BM  . heparin  5,000 Units Subcutaneous 3 times per day  . morphine       Continuous Infusions: . sodium chloride 50 mL/hr at 04/03/15 1645    Principal Problem:   Facial cellulitis    Time spent: 25 minutes.     [facility] LLCKULA,Mardie Kellen  [facility] Pager [phone]. If 7PM-7AM, please contact night-coverage at [email or web address],  password [email or web address] 04/03/2015, 6:46 PM  LOS: 1 day

## 2015-04-03 NOTE — Progress Notes (Signed)
INITIAL NUTRITION ASSESSMENT Pt meets criteria for SEVERE MALNUTRITION in the context of ACUTE ILLNESS as evidenced by eating <50% of est needs >5 days and weight loss of >2% in 1 week.  DOCUMENTATION CODES Per approved criteria  -Severe malnutrition in the context of acute illness or injury -underweight   INTERVENTION: Ensure Enlive po TID, each supplement provides 350 kcal and 20 grams of protein  Snacks per pt request  Recommend Re-weighing pt  NUTRITION DIAGNOSIS: Inadequate oral intake related to acute illness/loss of appetite as evidenced by eating only "bites" since Wednesday.   Goal: Pt to meet >/= 90% of their estimated nutrition needs   Monitor:  Oral intake, snack/supplement preferences, new weight  Reason for Assessment: MST  29 y.o. female  Admitting Dx: Facial cellulitis  ASSESSMENT: 29 y.o. female with no significant PMH presented to the ER with swelling and pain of her left face/nostril.Ct showed small abscess in nose caused by pimple  Spoke with pt and family. Pt has had only minimal intake since Wednesday. She reports having no appetite at all, likely d/t infectious process. Both her and her family do not believe her current weight is 97 lbs. She says she is SURE she has lost some weight, but do not think it is THAT dramatic. She reports always being "small" and has a normal weight of ~110-115 lbs.   She is very active and stressed d/t working 2 jobs which may have something to do with her low weight status.   Nutrition Focused Physical Exam: MD arrived during interview, didn't do.    Height: Ht Readings from Last 1 Encounters:  04/02/15  (1.651 m)    Weight: Wt Readings from Last 1 Encounters:  04/02/15 97 lb 1.6 oz (44.044 kg)    Ideal Body Weight: 125 lbs  % Ideal Body Weight: 78%  Wt Readings from Last 10 Encounters:  04/02/15 97 lb 1.6 oz (44.044 kg)  09/04/13 106 lb (48.081 kg)  08/28/13 105 lb (47.628 kg)  05/01/13 112 lb  (50.803 kg)  09/14/12 110 lb (49.896 kg)  08/13/12 103 lb (46.72 kg)  01/25/12 111 lb (50.349 kg)  11/20/11 113 lb (51.256 kg)  11/09/11 112 lb (50.803 kg)  10/21/11 110 lb (49.896 kg)    Usual Body Weight: 110-115 lbs  % Usual Body Weight: 88%- questions weight measurement  BMI:  Body mass index is 16.16 kg/(m^2).  Estimated Nutritional Needs: Kcal: 1650-1750 (37-40 kcal/kg) Protein: 66-75g (1.5-1.7 g/kg) Fluid: 1.7-1.8 liters  Skin: Swelling face/nostril  Diet Order: Diet NPO time specified  EDUCATION NEEDS: -No education needs identified at this time   Intake/Output Summary (Last 24 hours) at 04/03/15 0906 Last data filed at 04/03/15 0600  Gross per 24 hour  Intake 1831.67 ml  Output      0 ml  Net 1831.67 ml    Last BM: 4/29    Labs:   Recent Labs Lab 04/02/15 1806 04/03/15 0035  NA 137  --   K 3.5  --   CL 102  --   CO2 23  --   BUN 9  --   CREATININE 0.70 0.81  CALCIUM 9.5  --   GLUCOSE 86  --     CBG (last 3)  No results for input(s): GLUCAP in the last 72 hours.  Scheduled Meds: . ampicillin-sulbactam (UNASYN) IV  1.5 g Intravenous Q6H  . heparin  5,000 Units Subcutaneous 3 times per day  . lidocaine-EPINEPHrine  10 mL Infiltration Once  .  morphine        Continuous Infusions: . sodium chloride 125 mL/hr at 04/03/15 0000    History reviewed. No pertinent past medical history.  History reviewed. No pertinent past surgical history.  Christophe LouisNathan Kenon Delashmit RD, LDN Nutrition Pager: [phone] 04/03/2015 9:07 AM

## 2015-04-03 NOTE — Consult Note (Addendum)
Reason for Consult:Nasal abscess Referring Physician: ER  Bonnie Yang is an 29 y.o. female.  HPI: 29 year old female developed a pimple inside the left nostril early this week.  Pain and swelling began to worsen mid week.  She presented to the ER last night due to severe pain in nose and across cheeks with swelling.  She denies fever, chills, headache, or vision changes.  CT imaging demonstrates a left nasal abscess.  Admitted last night and treated with IV Unasyn after receiving a dose of Cleocin in the ER.  She continues with marked pain this morning.  History reviewed. No pertinent past medical history.  History reviewed. No pertinent past surgical history.  Family History  Problem Relation Age of Onset  . Hypertension Mother     Social History:  reports that she has been smoking Cigarettes.  She does not have any smokeless tobacco history on file. She reports that she drinks alcohol. She reports that she does not use illicit drugs.  Allergies: No Known Allergies  Medications: I have reviewed the patient's current medications.  Results for orders placed or performed during the hospital encounter of 04/02/15 (from the past 48 hour(s))  CBC with Differential     Status: Abnormal   Collection Time: 04/02/15  6:06 PM  Result Value Ref Range   WBC 10.4 4.0 - 10.5 K/uL   RBC 4.85 3.87 - 5.11 MIL/uL   Hemoglobin 14.4 12.0 - 15.0 g/dL   HCT 42.7 36.0 - 46.0 %   MCV 88.0 78.0 - 100.0 fL   MCH 29.7 26.0 - 34.0 pg   MCHC 33.7 30.0 - 36.0 g/dL   RDW 13.6 11.5 - 15.5 %   Platelets 182 150 - 400 K/uL   Neutrophils Relative % 70 43 - 77 %   Neutro Abs 7.3 1.7 - 7.7 K/uL   Lymphocytes Relative 17 12 - 46 %   Lymphs Abs 1.8 0.7 - 4.0 K/uL   Monocytes Relative 12 3 - 12 %   Monocytes Absolute 1.2 (H) 0.1 - 1.0 K/uL   Eosinophils Relative 1 0 - 5 %   Eosinophils Absolute 0.1 0.0 - 0.7 K/uL   Basophils Relative 0 0 - 1 %   Basophils Absolute 0.0 0.0 - 0.1 K/uL  Basic metabolic panel      Status: None   Collection Time: 04/02/15  6:06 PM  Result Value Ref Range   Sodium 137 135 - 145 mmol/L   Potassium 3.5 3.5 - 5.1 mmol/L   Chloride 102 96 - 112 mmol/L   CO2 23 19 - 32 mmol/L   Glucose, Bld 86 70 - 99 mg/dL   BUN 9 6 - 23 mg/dL   Creatinine, Ser 0.70 0.50 - 1.10 mg/dL   Calcium 9.5 8.4 - 10.5 mg/dL   GFR calc non Af Amer >90 >90 mL/min   GFR calc Af Amer >90 >90 mL/min    Comment: (NOTE) The eGFR has been calculated using the CKD EPI equation. This calculation has not been validated in all clinical situations. eGFR's persistently <90 mL/min signify possible Chronic Kidney Disease.    Anion gap 12 5 - 15  I-Stat beta hCG blood, ED     Status: None   Collection Time: 04/02/15  6:14 PM  Result Value Ref Range   I-stat hCG, quantitative <5.0 <5 mIU/mL   Comment 3            Comment:   GEST. AGE  CONC.  (mIU/mL)   <=1 WEEK        5 - 50     2 WEEKS       50 - 500     3 WEEKS       100 - 10,000     4 WEEKS     1,000 - 30,000        FEMALE AND NON-PREGNANT FEMALE:     LESS THAN 5 mIU/mL   I-Stat CG4 Lactic Acid, ED     Status: None   Collection Time: 04/02/15  6:15 PM  Result Value Ref Range   Lactic Acid, Venous 1.22 0.5 - 2.0 mmol/L  Pregnancy, urine     Status: None   Collection Time: 04/02/15  9:28 PM  Result Value Ref Range   Preg Test, Ur NEGATIVE NEGATIVE  CBC     Status: None   Collection Time: 04/03/15 12:35 AM  Result Value Ref Range   WBC 10.3 4.0 - 10.5 K/uL   RBC 4.31 3.87 - 5.11 MIL/uL   Hemoglobin 12.4 12.0 - 15.0 g/dL   HCT 37.1 36.0 - 46.0 %   MCV 86.1 78.0 - 100.0 fL   MCH 28.8 26.0 - 34.0 pg   MCHC 33.4 30.0 - 36.0 g/dL   RDW 13.4 11.5 - 15.5 %   Platelets 182 150 - 400 K/uL  Creatinine, serum     Status: None   Collection Time: 04/03/15 12:35 AM  Result Value Ref Range   Creatinine, Ser 0.81 0.50 - 1.10 mg/dL   GFR calc non Af Amer >90 >90 mL/min   GFR calc Af Amer >90 >90 mL/min    Comment: (NOTE) The eGFR has been  calculated using the CKD EPI equation. This calculation has not been validated in all clinical situations. eGFR's persistently <90 mL/min signify possible Chronic Kidney Disease.     Ct Maxillofacial W/cm  04/02/2015   CLINICAL DATA:  Enlarging bump on nose, left face swelling, began 03/29/2015  EXAM: CT MAXILLOFACIAL WITH CONTRAST  TECHNIQUE: Multidetector CT imaging of the maxillofacial structures was performed with intravenous contrast. Multiplanar CT image reconstructions were also generated. A small metallic BB was placed on the right temple in order to reliably differentiate right from left.  CONTRAST:  62m OMNIPAQUE IOHEXOL 300 MG/ML  SOLN  COMPARISON:  None.  FINDINGS: In the subcutaneous soft tissues of the left nose there is a low-attenuation rim enhancing oval abnormality measuring 9 x 14 mm. This causes mass effect on the nares. This is associated with increased subcutaneous attenuation of the nose rather diffusely and extending over the left maxillary sinus. Bilateral cervical chain adenopathy, with the largest lymph node on the left measuring 12 mm in short axis. No other abnormalities identified.  IMPRESSION: Evidence of soft tissue abscess was with associated tension cellulitis.   Electronically Signed   By: RSkipper ClicheM.D.   On: 04/02/2015 20:27    Review of Systems  All other systems reviewed and are negative.  Blood pressure 108/60, pulse 97, temperature 98.4 F (36.9 C), temperature source Oral, resp. rate 14, height 5' 5"  (1.651 m), weight 44.044 kg (97 lb 1.6 oz), last menstrual period 03/23/2015, SpO2 98 %. Physical Exam  Constitutional: She is oriented to person, place, and time. She appears well-developed and well-nourished. No distress.  HENT:  Head: Normocephalic and atraumatic.  Right Ear: External ear normal.  Left Ear: External ear normal.  Mouth/Throat: Oropharynx is clear and moist.  External nose  edematous and tender.  Sizeable rounded swelling inside  left nasal ala superiorly.  Mild edema of maxillae.  Eyes: Conjunctivae and EOM are normal. Pupils are equal, round, and reactive to light.  Neck: Normal range of motion. Neck supple.  Cardiovascular: Normal rate.   Respiratory: Effort normal.  Musculoskeletal: Normal range of motion.  Neurological: She is alert and oriented to person, place, and time. No cranial nerve deficit.  Skin: Skin is warm and dry.  Psychiatric: She has a normal mood and affect. Her behavior is normal. Judgment and thought content normal.    Assessment/Plan: Nasal abscess I personally reviewed her maxillofacial CT demonstrating a 9 x 14 mm well-defined fluid collection in the left nasal tip with surrounding inflammatory changes.  This corresponds to the swelling seen on exam.  I recommended incision and drainage of the abscess at the bedside.  A culture will be obtained.  A gauze strip will be left in the wound that I will plan to remove in two days.  See procedure note.  Continue IV antibiotic therapy at least until tomorrow, possibly the next day.  Dulcie Gammon 04/03/2015, 8:42 AM    Culture was sent.  May want to broaden coverage to include MRSA until culture result returns.

## 2015-04-03 NOTE — Procedures (Signed)
Preop diagnosis: Nasal abscess Postop diagnosis: Same Procedure: Incision and drainage of nasal abscess Surgeon: Jenne PaneBates Anesth: Local with 1% lidocaine with 1:100,000 epinephrine Compl: None Findings: A pocket of yellow pus was encountered and drained.  Culture was obtained. Description: After discussing risks, benefits, and alternatives, the left nasal ala fullness was injected in the skin with local anesthetic inside the ala.  After the area became numb, a scalpel was used to make an incision into the abscess from within the ala.  Yellow pus was encountered and a culture was obtained.  After expressing pus, a strip of 1/4 inch iodoform gauze was placed into the abscess cavity and partly out into the ala.  This was left in place.  She was then returned to nursing care in stable condition.

## 2015-04-04 MED ORDER — AMOXICILLIN-POT CLAVULANATE 875-125 MG PO TABS: 1.0000 | ORAL_TABLET | Freq: Two times a day (BID) | ORAL | Status: DC

## 2015-04-04 MED ORDER — HYDROCODONE-ACETAMINOPHEN 5-325 MG PO TABS: 1.0000 | ORAL_TABLET | ORAL | Status: DC | PRN

## 2015-04-04 MED ORDER — CLINDAMYCIN HCL 300 MG PO CAPS: 300.0000 mg | ORAL_CAPSULE | Freq: Three times a day (TID) | ORAL | Status: DC

## 2015-04-04 NOTE — Discharge Summary (Addendum)
Physician Discharge Summary  Bonnie PicklesVeronica L Yang UXN:[id number]:[id number] DOB: 04/22/1986 DOA: 04/02/2015  PCP: No PCP Per Patient  Admit date: 04/02/2015 Discharge date: 04/04/2015  Time spent: 25 minutes  Recommendations for Outpatient Follow-up:  1. Follow up with Dr Jenne PaneBates to follow the culture report 2. followup with Dr bates tomorrow to remove the gauze  Discharge Diagnoses:  Principal Problem:   Facial cellulitis nasal abscess Severe malnutrition.  Discharge Condition: improved  Diet recommendation: regular diet  Filed Weights   04/02/15 1703 04/02/15 2247  Weight: 49.896 kg (110 lb) 44.044 kg (97 lb 1.6 oz)    History of present illness:  Bonnie Yang is an 29 y.o. female with no prior significant PMH on no chronic medication presented to the ER with swelling and pain of her left face and left nostril.  Hospital Course:  29 YEAR OLD lady admitted for nasal abscess, s/p I&D, cultures pending, pain well controlled. Will be discharged home on oral antibiotic for another 7 days.   Procedures:  S/P i&d ON 4/30  Consultations: ENT Discharge Exam: Filed Vitals:   04/04/15 1325  BP: 111/60  Pulse: 69  Temp: 98.2 F (36.8 C)  Resp: 18    General: alert afebrile comfortable Cardiovascular: s1s2 Respiratory: ctab  Discharge Instructions    Current Discharge Medication List    START taking these medications   Details  clindamycin (CLEOCIN) 300 MG capsule Take 1 capsule (300 mg total) by mouth 3 (three) times daily. Qty: 21 capsule, Refills: 0    HYDROcodone-acetaminophen (NORCO/VICODIN) 5-325 MG per tablet Take 1-2 tablets by mouth every 4 (four) hours as needed for moderate pain. Qty: 10 tablet, Refills: 0      CONTINUE these medications which have NOT CHANGED   Details  Multiple Vitamin (MULTIVITAMIN WITH MINERALS) TABS tablet Take 1 tablet by mouth daily.    acetaminophen (TYLENOL) 500 MG tablet Take 1 tablet (500 mg total) by mouth every 6 (six) hours as needed  for pain. Qty: 30 tablet, Refills: 0    ibuprofen (ADVIL,MOTRIN) 800 MG tablet Take 1 tablet (800 mg total) by mouth 3 (three) times daily. Qty: 21 tablet, Refills: 0       No Known Allergies Follow-up Information    Follow up with BATES, DWIGHT, MD. Schedule an appointment as soon as possible for a visit in 1 day.   Specialty:  Otolaryngology   Contact information:   [address] [place] [address] [phone]        The results of significant diagnostics from this hospitalization (including imaging, microbiology, ancillary and laboratory) are listed below for reference.    Significant Diagnostic Studies: Ct Maxillofacial W/cm  04/02/2015   CLINICAL DATA:  Enlarging bump on nose, left face swelling, began 03/29/2015  EXAM: CT MAXILLOFACIAL WITH CONTRAST  TECHNIQUE: Multidetector CT imaging of the maxillofacial structures was performed with intravenous contrast. Multiplanar CT image reconstructions were also generated. A small metallic BB was placed on the right temple in order to reliably differentiate right from left.  CONTRAST:  75mL OMNIPAQUE IOHEXOL 300 MG/ML  SOLN  COMPARISON:  None.  FINDINGS: In the subcutaneous soft tissues of the left nose there is a low-attenuation rim enhancing oval abnormality measuring 9 x 14 mm. This causes mass effect on the nares. This is associated with increased subcutaneous attenuation of the nose rather diffusely and extending over the left maxillary sinus. Bilateral cervical chain adenopathy, with the largest lymph node on the left measuring 12 mm  in short axis. No other abnormalities identified.  IMPRESSION: Evidence of soft tissue abscess was with associated tension cellulitis.   Electronically Signed   By: Esperanza Heir M.D.   On: 04/02/2015 20:27    Microbiology: No results found for this or any previous visit (from the past 240 hour(s)).   Labs: Basic Metabolic Panel:  Recent Labs Lab 04/02/15 1806 04/03/15 0035   NA 137  --   K 3.5  --   CL 102  --   CO2 23  --   GLUCOSE 86  --   BUN 9  --   CREATININE 0.70 0.81  CALCIUM 9.5  --    Liver Function Tests: No results for input(s): AST, ALT, ALKPHOS, BILITOT, PROT, ALBUMIN in the last 168 hours. No results for input(s): LIPASE, AMYLASE in the last 168 hours. No results for input(s): AMMONIA in the last 168 hours. CBC:  Recent Labs Lab 04/02/15 1806 04/03/15 0035  WBC 10.4 10.3  NEUTROABS 7.3  --   HGB 14.4 12.4  HCT 42.7 37.1  MCV 88.0 86.1  PLT 182 182   Cardiac Enzymes: No results for input(s): CKTOTAL, CKMB, CKMBINDEX, TROPONINI in the last 168 hours. BNP: BNP (last 3 results) No results for input(s): BNP in the last 8760 hours.  ProBNP (last 3 results) No results for input(s): PROBNP in the last 8760 hours.  CBG: No results for input(s): GLUCAP in the last 168 hours.     SignedKathlen Mody  [facility] 04/04/2015, 1:38 PM

## 2015-04-04 NOTE — Progress Notes (Signed)
Patient ID: Marvia PicklesVeronica L Harwood, female   DOB: 08/05/1986, 29 y.o.   MRN: [id number] Feeling much better today.  Pain better controlled. AF VSS Alert NAD Facial edema improved.  Nose less tender.  Gauze strip remains in place. A/P: Nasal abscess s/p I&D Improving nicely.  Can probably be discharged on oral antibiotic.  Culture is still pending.  Will have her follow-up with me tomorrow for gauze removal.

## 2015-04-05 LAB — WOUND CULTURE
GRAM STAIN: NONE SEEN
SPECIAL REQUESTS: NORMAL

## 2015-11-30 ENCOUNTER — Emergency Department (HOSPITAL_COMMUNITY)
Admission: EM | Admit: 2015-11-30 | Discharge: 2015-11-30 | Disposition: A | Discharge status: Home / Self Care | Payer: Self-pay | Attending: Emergency Medicine | Admitting: Emergency Medicine

## 2015-11-30 ENCOUNTER — Encounter (HOSPITAL_COMMUNITY): Payer: Self-pay | Admitting: Emergency Medicine

## 2015-11-30 DIAGNOSIS — Z792 Long term (current) use of antibiotics: Secondary | ICD-10-CM | POA: Insufficient documentation

## 2015-11-30 DIAGNOSIS — F17210 Nicotine dependence, cigarettes, uncomplicated: Secondary | ICD-10-CM | POA: Insufficient documentation

## 2015-11-30 DIAGNOSIS — Z79899 Other long term (current) drug therapy: Secondary | ICD-10-CM | POA: Insufficient documentation

## 2015-11-30 DIAGNOSIS — J019 Acute sinusitis, unspecified: Secondary | ICD-10-CM | POA: Insufficient documentation

## 2015-11-30 MED ORDER — ONDANSETRON HCL 4 MG PO TABS
4.0000 mg | ORAL_TABLET | Freq: Once | ORAL | Status: AC
  Administered 2015-11-30: 4 mg via ORAL
  Filled 2015-11-30: qty 1

## 2015-11-30 MED ORDER — IBUPROFEN 800 MG PO TABS
800.0000 mg | ORAL_TABLET | Freq: Once | ORAL | Status: AC
  Administered 2015-11-30: 800 mg via ORAL
  Filled 2015-11-30: qty 1

## 2015-11-30 MED ORDER — HYDROCODONE-ACETAMINOPHEN 5-325 MG PO TABS: ORAL_TABLET | ORAL | Status: DC

## 2015-11-30 MED ORDER — CLINDAMYCIN HCL 300 MG PO CAPS: 300.0000 mg | ORAL_CAPSULE | Freq: Three times a day (TID) | ORAL | Status: DC

## 2015-11-30 MED ORDER — DEXAMETHASONE 4 MG PO TABS: ORAL_TABLET | ORAL | Status: DC

## 2015-11-30 MED ORDER — CLINDAMYCIN HCL 150 MG PO CAPS: ORAL_CAPSULE | ORAL | Status: DC

## 2015-11-30 MED ORDER — CLINDAMYCIN HCL 300 MG PO CAPS
300.0000 mg | ORAL_CAPSULE | Freq: Once | ORAL | Status: AC
  Administered 2015-11-30: 300 mg via ORAL
  Filled 2015-11-30: qty 1

## 2015-11-30 NOTE — Discharge Instructions (Signed)
Please increase fluids. Please use Tylenol every 4 hours, or ibuprofen every 6 hours for mild pain, use Norco for more severe pain. Norco may cause drowsiness, please do not drive, operate machinery, or pertussis updated in activities requiring concentration when taking this medication. Please use clindamycin 3 times daily, and Decadron 2 times daily with food. It is very important that you see Dr. Jenne PaneBates, or a member of his team Sinusitis, Adult Sinusitis is redness, soreness, and puffiness (inflammation) of the air pockets in the bones of your face (sinuses). The redness, soreness, and puffiness can cause air and mucus to get trapped in your sinuses. This can allow germs to grow and cause an infection.  HOME CARE   Drink enough fluids to keep your pee (urine) clear or pale yellow.  Use a humidifier in your home.  Run a hot shower to create steam in the bathroom. Sit in the bathroom with the door closed. Breathe in the steam 3-4 times a day.  Put a warm, moist washcloth on your face 3-4 times a day, or as told by your doctor.  Use salt water sprays (saline sprays) to wet the thick fluid in your nose. This can help the sinuses drain.  Only take medicine as told by your doctor. GET HELP RIGHT AWAY IF:   Your pain gets worse.  You have very bad headaches.  You are sick to your stomach (nauseous).  You throw up (vomit).  You are very sleepy (drowsy) all the time.  Your face is puffy (swollen).  Your vision changes.  You have a stiff neck.  You have trouble breathing. MAKE SURE YOU:   Understand these instructions.  Will watch your condition.  Will get help right away if you are not doing well or get worse.   This information is not intended to replace advice given to you by your health care provider. Make sure you discuss any questions you have with your health care provider.   Document Released: 05/08/2008 Document Revised: 12/11/2014 Document Reviewed: 06/25/2012 Elsevier  Interactive Patient Education [employer]2016 [employer]  for follow-up concerning your infection.

## 2015-11-30 NOTE — ED Provider Notes (Signed)
CSN: [id number]     Arrival date & time 11/30/15  1026 History  By signing my name below, I, Bonnie Yang, attest that this documentation has been prepared under the direction and in the presence of Bonnie QualeHobson Bonnie Livesay, PA-C Electronically Signed: Charline BillsEssence Yang, ED Scribe 11/30/2015 at 12:42 PM.   Chief Complaint  Patient presents with  . Abscess  . Sinus Problems    Patient is a 29 y.o. female presenting with abscess. The history is provided by the patient. No language interpreter was used.  Abscess Location:  Face Facial abscess location:  Nose Red streaking: no   Progression:  Worsening Chronicity:  Recurrent Relieved by:  None tried Worsened by:  Nothing tried Ineffective treatments:  None tried Risk factors: prior abscess    HPI Comments: Bonnie Yang is a 29 y.o. female who presents to the Emergency Department complaining of intermittent sinus congestion for the past month, worsening over the past 4 days. Pt states that congestion is exacerbated with lying at night. She reports associated swelling in her left nare onset 3 days ago, a throbbing, burning sensation in her nostrils with inhaling, and sensitivity to her nose with palpation. She further reports similar symptoms in May when she was seen at [facility] for congestion. Pt was diagnosed with an abscess of the nasal cavity which [facility]. She denies fever, colored rhinorrhea, cough. Pt states that she never followed up with ENT following her procedure.    History reviewed. No pertinent past medical history. Past Surgical History  Procedure Laterality Date  . Abcess drainage     Family History  Problem Relation Age of Onset  . Hypertension Mother    Social History  Substance Use Topics  . Smoking status: Current Every Day Smoker    Types: Cigarettes  . Smokeless tobacco: None  . Alcohol Use: Yes     Comment: 1I beer ocasionally   OB History    No data available     Review of Systems   HENT: Positive for congestion and rhinorrhea.   Respiratory: Negative for cough.   All other systems reviewed and are negative.  Allergies  Morphine and related  Home Medications   Prior to Admission medications   Medication Sig Start Date End Date Taking? Authorizing Provider  acetaminophen (TYLENOL) 500 MG tablet Take 1 tablet (500 mg total) by mouth every 6 (six) hours as needed for pain. Patient not taking: Reported on 04/02/2015 05/26/13   Bonnie HelperBowie Tran, PA-C  amoxicillin-clavulanate (AUGMENTIN) 875-125 MG per tablet Take 1 tablet by mouth 2 (two) times daily. 04/04/15   Bonnie ModyVijaya Akula, MD  HYDROcodone-acetaminophen (NORCO/VICODIN) 5-325 MG per tablet Take 1-2 tablets by mouth every 4 (four) hours as needed for moderate pain. 04/04/15   Bonnie ModyVijaya Akula, MD  ibuprofen (ADVIL,MOTRIN) 800 MG tablet Take 1 tablet (800 mg total) by mouth 3 (three) times daily. Patient not taking: Reported on 04/02/2015 09/04/13   Bonnie OctaveStephen Rancour, MD  Multiple Vitamin (MULTIVITAMIN WITH MINERALS) TABS tablet Take 1 tablet by mouth daily.    Historical Provider, MD   BP 136/96 mmHg  Pulse 86  Temp(Src) 98.1 F (36.7 C)  Resp 18  SpO2 100%  LMP 11/30/2015 (Exact Date) Physical Exam  Constitutional: She is oriented to person, place, and time. She appears well-developed and well-nourished. No distress.  HENT:  Head: Normocephalic and atraumatic.  Mouth/Throat: Oropharynx is clear and moist.  Head: No periorbital tenderness. No increase redness.  Nose: Tenderness to  palpation with some swelling at the tip of the nose. Swelling of the L nare. No ulcer of the septum seen. No purulent drainage noted but tenderness during the examination.  Mouth/Throat: Airway is patent. No swelling of the tongue. No abnormality of the soft palate.   Eyes: Conjunctivae and EOM are normal.  Neck: Normal range of motion. Neck supple. No tracheal deviation present.  Cardiovascular: Normal rate.   Pulmonary/Chest: Effort normal. No  respiratory distress.  Musculoskeletal: Normal range of motion.  Lymphadenopathy:    She has no cervical adenopathy.  Neurological: She is alert and oriented to person, place, and time.  Skin: Skin is warm and dry.  Psychiatric: She has a normal mood and affect. Her behavior is normal.  Nursing note and vitals reviewed.  ED Course  Procedures (including critical care time) DIAGNOSTIC STUDIES: Oxygen Saturation is 100% on RA, normal by my interpretation.    COORDINATION OF CARE: 12:37 PM-Discussed treatment plan which includes Clindamycin, Decadron, Norco and follow-up with Dr.Bates, ENT with pt at bedside and pt agreed to plan.   Labs Review Labs Reviewed - No data to display  Imaging Review No results found. I have personally reviewed and evaluated these images and lab results as part of my medical decision-making.   EKG Interpretation None      MDM Patient has a history of facial cellulitis as result of an abscess in the nares in April of this year. The patient states that she feels as though her nose is getting tender like it was at that time. She's not had any fever, she's not had any. Drainage noted. She's not had nausea or vomiting, and there's been no unusual dizziness.  I have reviewed the records from the previous hospitalization and emergency department visit. The patient's staph infection was sensitive to clindamycin, therefore we will start clindamycin 3 times daily with food. The patient will also be started on a short course of Decadron, and given Norco for pain not improved by Tylenol or ibuprofen. The patient will see Dr. Jeri Yang nose and throat for follow-up of this issue. The patient is in agreement with this discharge plan.    Final diagnoses:  None    **I have reviewed nursing notes, vital signs, and all appropriate lab and imaging results for this patient.*  **I personally performed the services described in this documentation, which was scribed in my  presence. The recorded information has been reviewed and is accurate.Bonnie Quale, PA-C 11/30/15 1310  Bethann Berkshire, MD 12/02/15 1027

## 2015-11-30 NOTE — ED Notes (Signed)
Pt reports nose congestion and clear drainage. Pt had abscess to left nostril with surgery in May and her nose is painful to touch. Denis SOB.

## 2016-01-30 ENCOUNTER — Emergency Department (HOSPITAL_COMMUNITY)
Admission: EM | Admit: 2016-01-30 | Discharge: 2016-01-30 | Disposition: A | Discharge status: Home / Self Care | Payer: No Typology Code available for payment source | Attending: Emergency Medicine | Admitting: Emergency Medicine

## 2016-01-30 ENCOUNTER — Encounter (HOSPITAL_COMMUNITY): Payer: Self-pay | Admitting: Emergency Medicine

## 2016-01-30 DIAGNOSIS — Z79899 Other long term (current) drug therapy: Secondary | ICD-10-CM | POA: Insufficient documentation

## 2016-01-30 DIAGNOSIS — Y9289 Other specified places as the place of occurrence of the external cause: Secondary | ICD-10-CM | POA: Insufficient documentation

## 2016-01-30 DIAGNOSIS — R000 Tachycardia, unspecified: Secondary | ICD-10-CM | POA: Insufficient documentation

## 2016-01-30 DIAGNOSIS — F1410 Cocaine abuse, uncomplicated: Secondary | ICD-10-CM | POA: Insufficient documentation

## 2016-01-30 DIAGNOSIS — F17210 Nicotine dependence, cigarettes, uncomplicated: Secondary | ICD-10-CM | POA: Insufficient documentation

## 2016-01-30 DIAGNOSIS — F1310 Sedative, hypnotic or anxiolytic abuse, uncomplicated: Secondary | ICD-10-CM | POA: Insufficient documentation

## 2016-01-30 DIAGNOSIS — T401X1A Poisoning by heroin, accidental (unintentional), initial encounter: Secondary | ICD-10-CM | POA: Insufficient documentation

## 2016-01-30 DIAGNOSIS — F1110 Opioid abuse, uncomplicated: Secondary | ICD-10-CM | POA: Insufficient documentation

## 2016-01-30 DIAGNOSIS — Y9389 Activity, other specified: Secondary | ICD-10-CM | POA: Insufficient documentation

## 2016-01-30 DIAGNOSIS — F1210 Cannabis abuse, uncomplicated: Secondary | ICD-10-CM | POA: Insufficient documentation

## 2016-01-30 DIAGNOSIS — Y998 Other external cause status: Secondary | ICD-10-CM | POA: Insufficient documentation

## 2016-01-30 DIAGNOSIS — F1910 Other psychoactive substance abuse, uncomplicated: Secondary | ICD-10-CM

## 2016-01-30 LAB — BASIC METABOLIC PANEL
Anion gap: 12 (ref 5–15)
BUN: 13 mg/dL (ref 6–20)
CO2: 22 mmol/L (ref 22–32)
CREATININE: 0.76 mg/dL (ref 0.44–1.00)
Calcium: 9.2 mg/dL (ref 8.9–10.3)
Chloride: 103 mmol/L (ref 101–111)
GFR calc Af Amer: >60 mL/min (ref >60)
GFR calc non Af Amer: >60 mL/min (ref >60)
Glucose, Bld: 131 mg/dL — ABNORMAL HIGH (ref 65–99)
POTASSIUM: 3.5 mmol/L (ref 3.5–5.1)
SODIUM: 137 mmol/L (ref 135–145)

## 2016-01-30 LAB — CBC WITH DIFFERENTIAL/PLATELET
BASOS ABS: 0 10*3/uL (ref 0.0–0.1)
Basophils Relative: 0 %
EOS ABS: 0.2 10*3/uL (ref 0.0–0.7)
Eosinophils Relative: 2 %
HCT: 41.4 % (ref 36.0–46.0)
HEMOGLOBIN: 13.4 g/dL (ref 12.0–15.0)
Lymphocytes Relative: 15 %
Lymphs Abs: 1.3 10*3/uL (ref 0.7–4.0)
MCH: 28.4 pg (ref 26.0–34.0)
MCHC: 32.4 g/dL (ref 30.0–36.0)
MCV: 87.7 fL (ref 78.0–100.0)
Monocytes Absolute: 0.9 10*3/uL (ref 0.1–1.0)
Monocytes Relative: 10 %
NEUTROS ABS: 6.3 10*3/uL (ref 1.7–7.7)
NEUTROS PCT: 73 %
Platelets: 195 10*3/uL (ref 150–400)
RBC: 4.72 MIL/uL (ref 3.87–5.11)
RDW: 13.8 % (ref 11.5–15.5)
WBC: 8.7 10*3/uL (ref 4.0–10.5)

## 2016-01-30 LAB — RAPID URINE DRUG SCREEN, HOSP PERFORMED
Amphetamines: NOT DETECTED
Barbiturates: NOT DETECTED
Benzodiazepines: POSITIVE — AB
COCAINE: POSITIVE — AB
OPIATES: POSITIVE — AB
Tetrahydrocannabinol: POSITIVE — AB

## 2016-01-30 LAB — ETHANOL: Alcohol, Ethyl (B): <5 mg/dL (ref <5)

## 2016-01-30 MED ORDER — SODIUM CHLORIDE 0.9 % IV BOLUS (SEPSIS)
1000.0000 mL | Freq: Once | INTRAVENOUS | Status: AC
  Administered 2016-01-30: 1000 mL via INTRAVENOUS

## 2016-01-30 NOTE — ED Provider Notes (Signed)
CSN: [id number]     Arrival date & time 01/30/16  0135 History  By signing my name below, I, Marisue Humble, attest that this documentation has been prepared under the direction and in the presence of Shon Baton, MD . Electronically Signed: Marisue Humble, Scribe. 01/30/2016. 1:53 AM.   Chief Complaint  Patient presents with  . Drug Overdose   The history is provided by the patient. No language interpreter was used.   HPI Comments:  Bonnie Yang is a 30 y.o. female brought in by EMS who presents to the Emergency Department s/p heroin overdose. Pt reports she snorted heroin for the first time last night. Pt also reports she had beer with liquor earlier in the evening. She states she doesn't remember anything else; she woke up when EMS arrived. Pt denies any pain currently or any intentions of self harm. Denies any other drug use. Does endorse occasional marijuana use. States that she has been very stressed. She received 2 mg of intranasal Narcan by first responders. Upon their arrival she was apneic with a pulse ox in the 50s. Vital signs improved after Narcan administration.  History reviewed. No pertinent past medical history. Past Surgical History  Procedure Laterality Date  . Abcess drainage     Family History  Problem Relation Age of Onset  . Hypertension Mother   . Heart failure Mother    Social History  Substance Use Topics  . Smoking status: Current Every Day Smoker    Types: Cigarettes  . Smokeless tobacco: None  . Alcohol Use: Yes     Comment: 1I beer ocasionally   OB History    No data available     Review of Systems  Constitutional:       Heroin overdose  Respiratory: Negative for shortness of breath.   Cardiovascular: Negative for chest pain.  Musculoskeletal: Negative for arthralgias.  Neurological:       Loss of consciousness  Psychiatric/Behavioral: Negative for self-injury.  All other systems reviewed and are negative.  Allergies  Morphine  and related  Home Medications   Prior to Admission medications   Medication Sig Start Date End Date Taking? Authorizing Provider  BIOTIN PO Take 1 tablet by mouth daily.   Yes Historical Provider, MD  diphenhydrAMINE (SOMINEX) 25 MG tablet Take 25 mg by mouth at bedtime as needed for sleep.   Yes Historical Provider, MD  ibuprofen (ADVIL,MOTRIN) 200 MG tablet Take 400 mg by mouth every 6 (six) hours as needed for mild pain.   Yes Historical Provider, MD  Omega-3 Fatty Acids (FISH OIL PO) Take 1 tablet by mouth daily.   Yes Historical Provider, MD  acetaminophen (TYLENOL) 500 MG tablet Take 1 tablet (500 mg total) by mouth every 6 (six) hours as needed for pain. Patient not taking: Reported on 04/02/2015 05/26/13   Fayrene Helper, PA-C  clindamycin (CLEOCIN) 300 MG capsule Take 1 capsule (300 mg total) by mouth 3 (three) times daily. Patient not taking: Reported on 01/30/2016 11/30/15   Ivery Quale, PA-C  dexamethasone (DECADRON) 4 MG tablet 1 po bid with food Patient not taking: Reported on 01/30/2016 11/30/15   Ivery Quale, PA-C  HYDROcodone-acetaminophen (NORCO/VICODIN) 5-325 MG tablet 1 or 2 tabs PO q4 hours prn pain Patient not taking: Reported on 01/30/2016 11/30/15   Ivery Quale, PA-C  ibuprofen (ADVIL,MOTRIN) 800 MG tablet Take 1 tablet (800 mg total) by mouth 3 (three) times daily. Patient not taking: Reported on 04/02/2015 09/04/13   Glynn Octave,  MD   BP 104/71 mmHg  Pulse 81  Temp(Src) 97.6 F (36.4 C) (Oral)  Resp 16  Ht  (1.651 m)  Wt 110 lb (49.896 kg)  BMI 18.31 kg/m2  SpO2 96%  LMP 01/19/2016 (Approximate) Physical Exam  Constitutional: She is oriented to person, place, and time.  Tearful, no acute distress  HENT:  Head: Normocephalic and atraumatic.  Eyes:  Pupils 2 mm and reactive bilaterally  Cardiovascular: Regular rhythm and normal heart sounds.   Tachycardia  Pulmonary/Chest: Effort normal. No respiratory distress. She has no wheezes.  Abdominal:  Soft. Bowel sounds are normal. There is no tenderness. There is no rebound.  Neurological: She is alert and oriented to person, place, and time.  Skin: Skin is warm and dry.  Psychiatric: She has a normal mood and affect.  Nursing note and vitals reviewed.   ED Course  Procedures  DIAGNOSTIC STUDIES:  Oxygen Saturation is 100% on RA, normal by my interpretation.    COORDINATION OF CARE:  1:48 AM Will order lab work and EKG. Discussed treatment plan with pt at bedside and pt agreed to plan.  Labs Review Labs Reviewed  BASIC METABOLIC PANEL - Abnormal; Notable for the following:    Glucose, Bld 131 (*)    All other components within normal limits  URINE RAPID DRUG SCREEN, HOSP PERFORMED - Abnormal; Notable for the following:    Opiates POSITIVE (*)    Cocaine POSITIVE (*)    Benzodiazepines POSITIVE (*)    Tetrahydrocannabinol POSITIVE (*)    All other components within normal limits  CBC WITH DIFFERENTIAL/PLATELET  ETHANOL  POC URINE PREG, ED    Imaging Review No results found. I have personally reviewed and evaluated these images and lab results as part of my medical decision-making.   EKG Interpretation   Date/Time:  Sunday January 30 2016 01:50:16 EST Ventricular Rate:  124 PR Interval:  80 QRS Duration: 82 QT Interval:  339 QTC Calculation: 487 R Axis:   92 Text Interpretation:  Sinus tachycardia Borderline right axis deviation  Borderline repolarization abnormality Borderline prolonged QT interval  Baseline wander in lead(s) V6 Confirmed by Lean Jaeger  MD, Siyah Mault ([id number]) on  01/30/2016 6:31:52 AM      MDM   Final diagnoses:  Heroin overdose, accidental or unintentional, initial encounter  Polysubstance abuse    Patient presents after being found unconscious. Reports heroin use tonight. Responded to Narcan. Is currently awake, alert, and oriented. Also endorses marijuana use. She is very tearful and anxious appearing. She was given fluids. UDS is positive  for cocaine, benzodiazepines, marijuana, and opiates. Patient denies cocaine use. She does state that she took a Xanax earlier today. Basic labwork obtained and reassuring. She was monitored in the emergency department for several hours without recurrence of somnolence or respiratory depression. Discussed with patient that she could have died. Encouraged patient to discontinue drug use.  After history, exam, and medical workup I feel the patient has been appropriately medically screened and is safe for discharge home. Pertinent diagnoses were discussed with the patient. Patient was given return precautions.  I personally performed the services described in this documentation, which was scribed in my presence. The recorded information has been reviewed and is accurate.    Shon Baton, MD 01/30/16 [phone]

## 2016-01-30 NOTE — Discharge Instructions (Signed)
You were seen today for a heroin overdose. There are multiple drugs in your system. You could have died.  Accidental Overdose A drug overdose occurs when a chemical substance (drug or medication) is used in amounts large enough to overcome a person. This may result in severe illness or death. This is a type of poisoning. Accidental overdoses of medications or other substances come from a variety of reasons. When this happens accidentally, it is often because the person taking the substance does not know enough about what they have taken. Drugs which commonly cause overdose deaths are alcohol, psychotropic medications (medications which affect the mind), pain medications, illegal drugs (street drugs) such as cocaine and heroin, and multiple drugs taken at the same time. It may result from careless behavior (such as over-indulging at a party). Other causes of overdose may include multiple drug use, a lapse in memory, or drug use after a period of no drug use.  Sometimes overdosing occurs because a person cannot remember if they have taken their medication.  A common unintentional overdose in young children involves multi-vitamins containing iron. Iron is a part of the hemoglobin molecule in blood. It is used to transport oxygen to living cells. When taken in small amounts, iron allows the body to restock hemoglobin. In large amounts, it causes problems in the body. If this overdose is not treated, it can lead to death. Never take medicines that show signs of tampering or do not seem quite right. Never take medicines in the dark or in poor lighting. Read the label and check each dose of medicine before you take it. When adults are poisoned, it happens most often through carelessness or lack of information. Taking medicines in the dark or taking medicine prescribed for someone else to treat the same type of problem is a dangerous practice. SYMPTOMS  Symptoms of overdose depend on the medication and amount taken.  They can vary from over-activity with stimulant over-dosage, to sleepiness from depressants such as alcohol, narcotics and tranquilizers. Confusion, dizziness, nausea and vomiting may be present. If problems are severe enough coma and death may result. DIAGNOSIS  Diagnosis and management are generally straightforward if the drug is known. Otherwise it is more difficult. At times, certain symptoms and signs exhibited by the patient, or blood tests, can reveal the drug in question.  TREATMENT  In an emergency department, most patients can be treated with supportive measures. Antidotes may be available if there has been an overdose of opioids or benzodiazepines. A rapid improvement will often occur if this is the cause of overdose. At home or away from medical care:  There may be no immediate problems or warning signs in children.  Not everything works well in all cases of poisoning.  Take immediate action. Poisons may act quickly.  If you think someone has swallowed medicine or a household product, and the person is unconscious, having seizures (convulsions), or is not breathing, immediately call for an ambulance. IF a person is conscious and appears to be doing OK but has swallowed a poison:  Do not wait to see what effect the poison will have. Immediately call a poison control center (listed in the white pages of your telephone book under "Poison Control" or inside the front cover with other emergency numbers). Some poison control centers have TTY capability for the deaf. Check with your local center if you or someone in your family requires this service.  Keep the container so you can read the label on the product  for ingredients.  Describe what, when, and how much was taken and the age and condition of the person poisoned. Inform them if the person is vomiting, choking, drowsy, shows a change in color or temperature of skin, is conscious or unconscious, or is convulsing.  Do not cause  vomiting unless instructed by medical personnel. Do not induce vomiting or force liquids into a person who is convulsing, unconscious, or very drowsy. Stay calm and in control.   Activated charcoal also is sometimes used in certain types of poisoning and you may wish to add a supply to your emergency medicines. It is available without a prescription. Call a poison control center before using this medication. PREVENTION  Thousands of children die every year from unintentional poisoning. This may be from household chemicals, poisoning from carbon monoxide in a car, taking their parent's medications, or simply taking a few iron pills or vitamins with iron. Poisoning comes from unexpected sources.  Store medicines out of the sight and reach of children, preferably in a locked cabinet. Do not keep medications in a food cabinet. Always store your medicines in a secure place. Get rid of expired medications.  If you have children living with you or have them as occasional guests, you should have child-resistant caps on your medicine containers. Keep everything out of reach. Child proof your home.  If you are called to the telephone or to answer the door while you are taking a medicine, take the container with you or put the medicine out of the reach of small children.  Do not take your medication in front of children. Do not tell your child how good a medication is and how good it is for them. They may get the idea it is more of a treat.  If you are an adult and have accidentally taken an overdose, you need to consider how this happened and what can be done to prevent it from happening again. If this was from a street drug or alcohol, determine if there is a problem that needs addressing. If you are not sure a problems exists, it is easy to talk to a professional and ask them if they think you have a problem. It is better to handle this problem in this way before it happens again and has a much worse  consequence.   This information is not intended to replace advice given to you by your health care provider. Make sure you discuss any questions you have with your health care provider.   Document Released: 02/03/2005 Document Revised: 12/11/2014 Document Reviewed: 05/10/2015 Elsevier Interactive Patient Education [employer]

## 2016-01-30 NOTE — ED Notes (Signed)
Pt walked in hall with no assist, now drinking coffee

## 2016-01-30 NOTE — ED Notes (Signed)
Per EMS they got called out for an overdose  Fire dept got there prior to EMS and found pt on the floor by the bed unresponsive with a pulse ox in the 50's  They gave Narcan  intranasally  When EMS arrived pt was breathing 18 times a minute and was awake  Pt states she snorted heroin earlier tonight amount and time unknown   Monitor reading sinus rhythm with a rate about 100  Pt has been alert and oriented x 3 with EMS  No acute distress noted upon arrival

## 2016-03-27 ENCOUNTER — Ambulatory Visit: Payer: Self-pay | Admitting: Licensed Clinical Social Worker

## 2016-04-03 HISTORY — PX: ABCESS DRAINAGE: SHX399

## 2016-06-03 DIAGNOSIS — N390 Urinary tract infection, site not specified: Secondary | ICD-10-CM

## 2016-06-03 HISTORY — DX: Urinary tract infection, site not specified: N39.0

## 2016-06-10 ENCOUNTER — Inpatient Hospital Stay (HOSPITAL_COMMUNITY): Payer: Self-pay | Admitting: Certified Registered"

## 2016-06-10 ENCOUNTER — Emergency Department (HOSPITAL_COMMUNITY): Payer: Self-pay

## 2016-06-10 ENCOUNTER — Inpatient Hospital Stay (HOSPITAL_COMMUNITY)
Admission: EM | Admit: 2016-06-10 | Discharge: 2016-06-13 | DRG: 603 | Disposition: A | Discharge status: Home / Self Care | Payer: Self-pay | Attending: Family Medicine | Admitting: Family Medicine

## 2016-06-10 ENCOUNTER — Encounter (HOSPITAL_COMMUNITY): Payer: Self-pay | Admitting: *Deleted

## 2016-06-10 ENCOUNTER — Encounter (HOSPITAL_COMMUNITY)
Admission: EM | Disposition: A | Discharge status: Home / Self Care | Payer: Self-pay | Source: Home / Self Care | Attending: Family Medicine

## 2016-06-10 DIAGNOSIS — R200 Anesthesia of skin: Secondary | ICD-10-CM | POA: Diagnosis present

## 2016-06-10 DIAGNOSIS — F1490 Cocaine use, unspecified, uncomplicated: Secondary | ICD-10-CM | POA: Diagnosis present

## 2016-06-10 DIAGNOSIS — L02413 Cutaneous abscess of right upper limb: Secondary | ICD-10-CM | POA: Diagnosis present

## 2016-06-10 DIAGNOSIS — B1920 Unspecified viral hepatitis C without hepatic coma: Secondary | ICD-10-CM | POA: Diagnosis present

## 2016-06-10 DIAGNOSIS — L03113 Cellulitis of right upper limb: Principal | ICD-10-CM | POA: Diagnosis present

## 2016-06-10 DIAGNOSIS — N390 Urinary tract infection, site not specified: Secondary | ICD-10-CM | POA: Diagnosis present

## 2016-06-10 DIAGNOSIS — M65121 Other infective (teno)synovitis, right elbow: Secondary | ICD-10-CM | POA: Diagnosis present

## 2016-06-10 DIAGNOSIS — K5909 Other constipation: Secondary | ICD-10-CM | POA: Diagnosis present

## 2016-06-10 DIAGNOSIS — L03119 Cellulitis of unspecified part of limb: Secondary | ICD-10-CM | POA: Diagnosis present

## 2016-06-10 DIAGNOSIS — F1290 Cannabis use, unspecified, uncomplicated: Secondary | ICD-10-CM | POA: Diagnosis present

## 2016-06-10 DIAGNOSIS — R768 Other specified abnormal immunological findings in serum: Secondary | ICD-10-CM | POA: Diagnosis present

## 2016-06-10 DIAGNOSIS — F17210 Nicotine dependence, cigarettes, uncomplicated: Secondary | ICD-10-CM | POA: Diagnosis present

## 2016-06-10 DIAGNOSIS — F1190 Opioid use, unspecified, uncomplicated: Secondary | ICD-10-CM

## 2016-06-10 DIAGNOSIS — M65131 Other infective (teno)synovitis, right wrist: Secondary | ICD-10-CM | POA: Diagnosis present

## 2016-06-10 DIAGNOSIS — Z885 Allergy status to narcotic agent status: Secondary | ICD-10-CM

## 2016-06-10 DIAGNOSIS — F1910 Other psychoactive substance abuse, uncomplicated: Secondary | ICD-10-CM | POA: Diagnosis present

## 2016-06-10 DIAGNOSIS — F1110 Opioid abuse, uncomplicated: Secondary | ICD-10-CM | POA: Diagnosis present

## 2016-06-10 HISTORY — DX: Urinary tract infection, site not specified: N39.0

## 2016-06-10 HISTORY — DX: Anemia, unspecified: D64.9

## 2016-06-10 HISTORY — DX: Other specified abnormal immunological findings in serum: R76.8

## 2016-06-10 HISTORY — DX: Tobacco use: Z72.0

## 2016-06-10 HISTORY — PX: I & D EXTREMITY: SHX5045

## 2016-06-10 HISTORY — DX: Gastro-esophageal reflux disease without esophagitis: K21.9

## 2016-06-10 HISTORY — DX: Other psychoactive substance abuse, uncomplicated: F19.10

## 2016-06-10 LAB — CREATININE, SERUM
Creatinine, Ser: 0.74 mg/dL (ref 0.44–1.00)
GFR calc non Af Amer: >60 mL/min (ref >60)

## 2016-06-10 LAB — URINALYSIS, ROUTINE W REFLEX MICROSCOPIC
Glucose, UA: NEGATIVE mg/dL
Ketones, ur: >80 mg/dL — AB
Leukocytes, UA: NEGATIVE
NITRITE: POSITIVE — AB
pH: 5.5 (ref 5.0–8.0)

## 2016-06-10 LAB — CBC WITH DIFFERENTIAL/PLATELET
BASOS ABS: 0 10*3/uL (ref 0.0–0.1)
BASOS PCT: 0 %
EOS ABS: 0 10*3/uL (ref 0.0–0.7)
EOS PCT: 0 %
HEMATOCRIT: 44.8 % (ref 36.0–46.0)
Hemoglobin: 15.1 g/dL — ABNORMAL HIGH (ref 12.0–15.0)
Lymphocytes Relative: 4 %
Lymphs Abs: 1 10*3/uL (ref 0.7–4.0)
MCH: 28.8 pg (ref 26.0–34.0)
MCHC: 33.7 g/dL (ref 30.0–36.0)
MCV: 85.3 fL (ref 78.0–100.0)
MONOS PCT: 10 %
Monocytes Absolute: 2.3 10*3/uL — ABNORMAL HIGH (ref 0.1–1.0)
Neutro Abs: 19.6 10*3/uL — ABNORMAL HIGH (ref 1.7–7.7)
Neutrophils Relative %: 86 %
PLATELETS: 235 10*3/uL (ref 150–400)
RBC: 5.25 MIL/uL — ABNORMAL HIGH (ref 3.87–5.11)
RDW: 14.6 % (ref 11.5–15.5)
WBC: 23 10*3/uL — ABNORMAL HIGH (ref 4.0–10.5)

## 2016-06-10 LAB — BASIC METABOLIC PANEL
ANION GAP: 10 (ref 5–15)
BUN: 9 mg/dL (ref 6–20)
CO2: 24 mmol/L (ref 22–32)
CREATININE: 0.71 mg/dL (ref 0.44–1.00)
Calcium: 8.7 mg/dL — ABNORMAL LOW (ref 8.9–10.3)
Chloride: 101 mmol/L (ref 101–111)
Glucose, Bld: 103 mg/dL — ABNORMAL HIGH (ref 65–99)
Potassium: 3.8 mmol/L (ref 3.5–5.1)
Sodium: 135 mmol/L (ref 135–145)

## 2016-06-10 LAB — URINE MICROSCOPIC-ADD ON

## 2016-06-10 LAB — CBC
HEMATOCRIT: 39.3 % (ref 36.0–46.0)
Hemoglobin: 13.1 g/dL (ref 12.0–15.0)
MCH: 28.5 pg (ref 26.0–34.0)
MCHC: 33.3 g/dL (ref 30.0–36.0)
MCV: 85.6 fL (ref 78.0–100.0)
Platelets: 225 10*3/uL (ref 150–400)
RBC: 4.59 MIL/uL (ref 3.87–5.11)
RDW: 14.7 % (ref 11.5–15.5)
WBC: 24.5 10*3/uL — AB (ref 4.0–10.5)

## 2016-06-10 SURGERY — IRRIGATION AND DEBRIDEMENT EXTREMITY
Anesthesia: General | Site: Arm Lower | Laterality: Right

## 2016-06-10 MED ORDER — METHOCARBAMOL 500 MG PO TABS
ORAL_TABLET | ORAL | Status: AC
  Administered 2016-06-11: 500 mg via ORAL
  Filled 2016-06-10: qty 1

## 2016-06-10 MED ORDER — VANCOMYCIN HCL IN DEXTROSE 750-5 MG/150ML-% IV SOLN
750.0000 mg | Freq: Two times a day (BID) | INTRAVENOUS | Status: DC
  Administered 2016-06-11 (×2): 750 mg via INTRAVENOUS
  Filled 2016-06-10 (×9): qty 150

## 2016-06-10 MED ORDER — DIPHENHYDRAMINE HCL 25 MG PO CAPS: 25.0000 mg | ORAL_CAPSULE | Freq: Four times a day (QID) | ORAL | Status: DC | PRN

## 2016-06-10 MED ORDER — BUPIVACAINE HCL (PF) 0.25 % IJ SOLN
INTRAMUSCULAR | Status: DC | PRN
  Administered 2016-06-10: 10 mL

## 2016-06-10 MED ORDER — ONDANSETRON HCL 4 MG/2ML IJ SOLN
INTRAMUSCULAR | Status: AC
  Filled 2016-06-10: qty 2

## 2016-06-10 MED ORDER — FENTANYL CITRATE (PF) 250 MCG/5ML IJ SOLN
INTRAMUSCULAR | Status: DC | PRN
  Administered 2016-06-10: 100 ug via INTRAVENOUS
  Administered 2016-06-10 (×3): 50 ug via INTRAVENOUS

## 2016-06-10 MED ORDER — SULFAMETHOXAZOLE-TRIMETHOPRIM 800-160 MG PO TABS
1.0000 | ORAL_TABLET | Freq: Two times a day (BID) | ORAL | Status: DC
  Administered 2016-06-10: 1 via ORAL
  Filled 2016-06-10 (×2): qty 1

## 2016-06-10 MED ORDER — MIDAZOLAM HCL 2 MG/2ML IJ SOLN
INTRAMUSCULAR | Status: AC
  Filled 2016-06-10: qty 2

## 2016-06-10 MED ORDER — ONDANSETRON HCL 4 MG PO TABS: 4.0000 mg | ORAL_TABLET | Freq: Four times a day (QID) | ORAL | Status: DC | PRN

## 2016-06-10 MED ORDER — TEMAZEPAM 15 MG PO CAPS
15.0000 mg | ORAL_CAPSULE | Freq: Every evening | ORAL | Status: DC | PRN
  Administered 2016-06-13: 15 mg via ORAL
  Filled 2016-06-10: qty 1

## 2016-06-10 MED ORDER — VANCOMYCIN HCL IN DEXTROSE 1-5 GM/200ML-% IV SOLN
1000.0000 mg | Freq: Once | INTRAVENOUS | Status: AC
  Administered 2016-06-10: 1000 mg via INTRAVENOUS
  Filled 2016-06-10: qty 200

## 2016-06-10 MED ORDER — PROPOFOL 10 MG/ML IV BOLUS
INTRAVENOUS | Status: AC
  Filled 2016-06-10: qty 20

## 2016-06-10 MED ORDER — LIDOCAINE 2% (20 MG/ML) 5 ML SYRINGE
INTRAMUSCULAR | Status: DC | PRN
  Administered 2016-06-10: 40 mg via INTRAVENOUS

## 2016-06-10 MED ORDER — ENOXAPARIN SODIUM 40 MG/0.4ML ~~LOC~~ SOLN
40.0000 mg | SUBCUTANEOUS | Status: DC
  Administered 2016-06-11 – 2016-06-13 (×3): 40 mg via SUBCUTANEOUS
  Filled 2016-06-10 (×4): qty 0.4

## 2016-06-10 MED ORDER — BUPIVACAINE HCL (PF) 0.25 % IJ SOLN
INTRAMUSCULAR | Status: AC
  Filled 2016-06-10: qty 30

## 2016-06-10 MED ORDER — METHOCARBAMOL 1000 MG/10ML IJ SOLN
500.0000 mg | Freq: Four times a day (QID) | INTRAVENOUS | Status: DC | PRN
  Filled 2016-06-10: qty 5

## 2016-06-10 MED ORDER — HYDROMORPHONE HCL 1 MG/ML IJ SOLN
1.0000 mg | Freq: Once | INTRAMUSCULAR | Status: AC
  Administered 2016-06-10: 1 mg via INTRAVENOUS
  Filled 2016-06-10: qty 1

## 2016-06-10 MED ORDER — FENTANYL CITRATE (PF) 100 MCG/2ML IJ SOLN
50.0000 ug | Freq: Once | INTRAMUSCULAR | Status: AC
  Administered 2016-06-10: 50 ug via INTRAVENOUS
  Filled 2016-06-10: qty 2

## 2016-06-10 MED ORDER — FENTANYL CITRATE (PF) 100 MCG/2ML IJ SOLN
INTRAMUSCULAR | Status: AC
  Administered 2016-06-10: 50 ug via INTRAVENOUS
  Filled 2016-06-10: qty 2

## 2016-06-10 MED ORDER — FENTANYL CITRATE (PF) 100 MCG/2ML IJ SOLN
100.0000 ug | Freq: Once | INTRAMUSCULAR | Status: AC
  Administered 2016-06-10: 100 ug via INTRAVENOUS
  Filled 2016-06-10: qty 2

## 2016-06-10 MED ORDER — SODIUM CHLORIDE 0.9 % IV SOLN
INTRAVENOUS | Status: DC
  Administered 2016-06-10: 15:00:00 via INTRAVENOUS
  Administered 2016-06-11: 75 mL/h via INTRAVENOUS

## 2016-06-10 MED ORDER — MIDAZOLAM HCL 5 MG/5ML IJ SOLN
INTRAMUSCULAR | Status: DC | PRN
  Administered 2016-06-10: 2 mg via INTRAVENOUS

## 2016-06-10 MED ORDER — FENTANYL CITRATE (PF) 250 MCG/5ML IJ SOLN
INTRAMUSCULAR | Status: AC
  Filled 2016-06-10: qty 5

## 2016-06-10 MED ORDER — ONDANSETRON HCL 4 MG/2ML IJ SOLN
INTRAMUSCULAR | Status: DC | PRN
  Administered 2016-06-10: 4 mg via INTRAVENOUS

## 2016-06-10 MED ORDER — LACTATED RINGERS IV SOLN
INTRAVENOUS | Status: DC
  Administered 2016-06-10: 100 mL/h via INTRAVENOUS
  Administered 2016-06-12: 22:00:00 via INTRAVENOUS

## 2016-06-10 MED ORDER — SODIUM CHLORIDE 0.9 % IV SOLN
3.0000 g | Freq: Three times a day (TID) | INTRAVENOUS | Status: AC
  Administered 2016-06-10 – 2016-06-12 (×7): 3 g via INTRAVENOUS
  Filled 2016-06-10 (×8): qty 3

## 2016-06-10 MED ORDER — PROPOFOL 10 MG/ML IV BOLUS
INTRAVENOUS | Status: DC | PRN
  Administered 2016-06-10: 170 mg via INTRAVENOUS

## 2016-06-10 MED ORDER — ACETAMINOPHEN 325 MG PO TABS
650.0000 mg | ORAL_TABLET | Freq: Four times a day (QID) | ORAL | Status: DC | PRN
  Administered 2016-06-10 – 2016-06-12 (×4): 650 mg via ORAL
  Filled 2016-06-10 (×4): qty 2

## 2016-06-10 MED ORDER — ONDANSETRON HCL 4 MG/2ML IJ SOLN: 4.0000 mg | Freq: Four times a day (QID) | INTRAMUSCULAR | Status: DC | PRN

## 2016-06-10 MED ORDER — VITAMIN C 500 MG PO TABS
1000.0000 mg | ORAL_TABLET | Freq: Every day | ORAL | Status: DC
  Administered 2016-06-10 – 2016-06-13 (×4): 1000 mg via ORAL
  Filled 2016-06-10 (×4): qty 2

## 2016-06-10 MED ORDER — LACTATED RINGERS IV SOLN
INTRAVENOUS | Status: DC | PRN
  Administered 2016-06-10: 19:00:00 via INTRAVENOUS

## 2016-06-10 MED ORDER — NICOTINE 21 MG/24HR TD PT24
21.0000 mg | MEDICATED_PATCH | Freq: Every day | TRANSDERMAL | Status: DC
  Administered 2016-06-10 – 2016-06-13 (×4): 21 mg via TRANSDERMAL
  Filled 2016-06-10 (×4): qty 1

## 2016-06-10 MED ORDER — ACETAMINOPHEN 650 MG RE SUPP: 650.0000 mg | Freq: Four times a day (QID) | RECTAL | Status: DC | PRN

## 2016-06-10 MED ORDER — LIDOCAINE 2% (20 MG/ML) 5 ML SYRINGE
INTRAMUSCULAR | Status: AC
  Filled 2016-06-10: qty 5

## 2016-06-10 MED ORDER — METHOCARBAMOL 500 MG PO TABS
500.0000 mg | ORAL_TABLET | Freq: Four times a day (QID) | ORAL | Status: DC | PRN
  Administered 2016-06-10 – 2016-06-13 (×8): 500 mg via ORAL
  Filled 2016-06-10 (×8): qty 1

## 2016-06-10 MED ORDER — ONDANSETRON HCL 4 MG/2ML IJ SOLN: 4.0000 mg | Freq: Once | INTRAMUSCULAR | Status: DC | PRN

## 2016-06-10 MED ORDER — SODIUM CHLORIDE 0.9 % IV BOLUS (SEPSIS)
500.0000 mL | Freq: Once | INTRAVENOUS | Status: AC
  Administered 2016-06-10: 500 mL via INTRAVENOUS

## 2016-06-10 MED ORDER — FENTANYL CITRATE (PF) 100 MCG/2ML IJ SOLN
25.0000 ug | INTRAMUSCULAR | Status: DC | PRN
  Administered 2016-06-10 (×3): 50 ug via INTRAVENOUS

## 2016-06-10 MED ORDER — FENTANYL CITRATE (PF) 100 MCG/2ML IJ SOLN
25.0000 ug | INTRAMUSCULAR | Status: DC | PRN
  Administered 2016-06-10 – 2016-06-11 (×10): 25 ug via INTRAVENOUS
  Filled 2016-06-10 (×11): qty 2

## 2016-06-10 MED ORDER — ONDANSETRON HCL 4 MG/2ML IJ SOLN
4.0000 mg | Freq: Once | INTRAMUSCULAR | Status: AC
  Administered 2016-06-10: 4 mg via INTRAVENOUS
  Filled 2016-06-10: qty 2

## 2016-06-10 SURGICAL SUPPLY — 32 items
BANDAGE ELASTIC 3 VELCRO ST LF (GAUZE/BANDAGES/DRESSINGS) ×2 IMPLANT
BANDAGE ELASTIC 4 VELCRO ST LF (GAUZE/BANDAGES/DRESSINGS) ×2 IMPLANT
BNDG CMPR 9X4 STRL LF SNTH (GAUZE/BANDAGES/DRESSINGS) ×1
BNDG ESMARK 4X9 LF (GAUZE/BANDAGES/DRESSINGS) ×1 IMPLANT
BNDG GAUZE ELAST 4 BULKY (GAUZE/BANDAGES/DRESSINGS) ×2 IMPLANT
CORDS BIPOLAR (ELECTRODE) ×2 IMPLANT
COVER SURGICAL LIGHT HANDLE (MISCELLANEOUS) ×2 IMPLANT
DECANTER SPIKE VIAL GLASS SM (MISCELLANEOUS) ×2 IMPLANT
DRSG PAD ABDOMINAL 8X10 ST (GAUZE/BANDAGES/DRESSINGS) ×3 IMPLANT
GAUZE PACKING IODOFORM 1X5 (MISCELLANEOUS) ×1 IMPLANT
GAUZE SPONGE 4X4 12PLY STRL (GAUZE/BANDAGES/DRESSINGS) ×2 IMPLANT
GLOVE BIO SURGEON STRL SZ7.5 (GLOVE) ×2 IMPLANT
GLOVE BIOGEL PI IND STRL 8 (GLOVE) ×1 IMPLANT
GLOVE BIOGEL PI INDICATOR 8 (GLOVE) ×1
GOWN STRL REUS W/ TWL LRG LVL3 (GOWN DISPOSABLE) ×1 IMPLANT
GOWN STRL REUS W/TWL LRG LVL3 (GOWN DISPOSABLE) ×2
KIT BASIN OR (CUSTOM PROCEDURE TRAY) ×2 IMPLANT
KIT ROOM TURNOVER OR (KITS) ×2 IMPLANT
MANIFOLD NEPTUNE II (INSTRUMENTS) ×2 IMPLANT
NDL HYPO 25X1 1.5 SAFETY (NEEDLE) IMPLANT
NEEDLE HYPO 25X1 1.5 SAFETY (NEEDLE) IMPLANT
PACK ORTHO EXTREMITY (CUSTOM PROCEDURE TRAY) ×2 IMPLANT
PAD ARMBOARD 7.5X6 YLW CONV (MISCELLANEOUS) ×4 IMPLANT
SET CYSTO W/LG BORE CLAMP LF (SET/KITS/TRAYS/PACK) ×2 IMPLANT
SPLINT PLASTER CAST XFAST 5X30 (CAST SUPPLIES) IMPLANT
SPLINT PLASTER XFAST SET 5X30 (CAST SUPPLIES) ×1
SPONGE GAUZE 4X4 12PLY STER LF (GAUZE/BANDAGES/DRESSINGS) ×1 IMPLANT
SYR CONTROL 10ML LL (SYRINGE) IMPLANT
TOWEL OR 17X26 10 PK STRL BLUE (TOWEL DISPOSABLE) ×2 IMPLANT
TUBE ANAEROBIC SPECIMEN COL (MISCELLANEOUS) IMPLANT
TUBE CONNECTING 12X1/4 (SUCTIONS) ×2 IMPLANT
UNDERPAD 30X30 INCONTINENT (UNDERPADS AND DIAPERS) ×2 IMPLANT

## 2016-06-10 NOTE — Anesthesia Procedure Notes (Signed)
Procedure Name: LMA Insertion Date/Time: 06/10/2016 7:39 PM Performed by: Arlice ColtMANESS, Lilyann Gravelle B Pre-anesthesia Checklist: Patient identified, Emergency Drugs available, Suction available, Patient being monitored and Timeout performed Patient Re-evaluated:Patient Re-evaluated prior to inductionOxygen Delivery Method: Circle system utilized Preoxygenation: Pre-oxygenation with 100% oxygen Intubation Type: IV induction LMA: LMA inserted LMA Size: 4.0 Number of attempts: 1 Placement Confirmation: positive ETCO2 and breath sounds checked- equal and bilateral Tube secured with: Tape Dental Injury: Teeth and Oropharynx as per pre-operative assessment

## 2016-06-10 NOTE — Consult Note (Signed)
Bonnie Yang is an 30 y.o. female.   Chief Complaint: right arm infection HPI: 31 yo rhd female present with family members states she has been having increasing pain, swelling, erythema of the right elbow/arm for 3 days.  This is a throbbing pain that she rates at 10/10.  It is worsened with movement and alleviated with immobilization.  She has had chills and vomiting.   Case discussed with Nat Christen, MD and his note from 06/10/2016 reviewed. Xrays viewed and interpreted by me: 4 views right elbow show no fracture, dislocation, radioopaque foreign body.   Labs reviewed: WBC 23.0  Allergies:  Allergies  Allergen Reactions  . Morphine And Related Hives    History reviewed. No pertinent past medical history.  Past Surgical History  Procedure Laterality Date  . Abcess drainage      Family History: Family History  Problem Relation Age of Onset  . Hypertension Mother   . Heart failure Mother     Social History:   reports that she has been smoking Cigarettes.  She has been smoking about 0.50 packs per day. She does not have any smokeless tobacco history on file. She reports that she drinks alcohol. She reports that she uses illicit drugs (Marijuana).  Medications: No prescriptions prior to admission    Results for orders placed or performed during the hospital encounter of 06/10/16 (from the past 48 hour(s))  CBC with Differential     Status: Abnormal   Collection Time: 06/10/16  9:11 AM  Result Value Ref Range   WBC 23.0 (H) 4.0 - 10.5 K/uL   RBC 5.25 (H) 3.87 - 5.11 MIL/uL   Hemoglobin 15.1 (H) 12.0 - 15.0 g/dL   HCT 44.8 36.0 - 46.0 %   MCV 85.3 78.0 - 100.0 fL   MCH 28.8 26.0 - 34.0 pg   MCHC 33.7 30.0 - 36.0 g/dL   RDW 14.6 11.5 - 15.5 %   Platelets 235 150 - 400 K/uL   Neutrophils Relative % 86 %   Neutro Abs 19.6 (H) 1.7 - 7.7 K/uL   Lymphocytes Relative 4 %   Lymphs Abs 1.0 0.7 - 4.0 K/uL   Monocytes Relative 10 %   Monocytes Absolute 2.3 (H) 0.1 - 1.0 K/uL    Eosinophils Relative 0 %   Eosinophils Absolute 0.0 0.0 - 0.7 K/uL   Basophils Relative 0 %   Basophils Absolute 0.0 0.0 - 0.1 K/uL  Basic metabolic panel     Status: Abnormal   Collection Time: 06/10/16  9:11 AM  Result Value Ref Range   Sodium 135 135 - 145 mmol/L   Potassium 3.8 3.5 - 5.1 mmol/L   Chloride 101 101 - 111 mmol/L   CO2 24 22 - 32 mmol/L   Glucose, Bld 103 (H) 65 - 99 mg/dL   BUN 9 6 - 20 mg/dL   Creatinine, Ser 0.71 0.44 - 1.00 mg/dL   Calcium 8.7 (L) 8.9 - 10.3 mg/dL   GFR calc non Af Amer >60 >60 mL/min   GFR calc Af Amer >60 >60 mL/min    Comment: (NOTE) The eGFR has been calculated using the CKD EPI equation. This calculation has not been validated in all clinical situations. eGFR's persistently <60 mL/min signify possible Chronic Kidney Disease.    Anion gap 10 5 - 15  Urinalysis, Routine w reflex microscopic (not at [facility])     Status: Abnormal   Collection Time: 06/10/16 10:35 AM  Result Value Ref Range  Color, Urine AMBER (A) YELLOW    Comment: BIOCHEMICALS MAY BE AFFECTED BY COLOR   APPearance HAZY (A) CLEAR   Specific Gravity, Urine >1.030 (H) 1.005 - 1.030   pH 5.5 5.0 - 8.0   Glucose, UA NEGATIVE NEGATIVE mg/dL   Hgb urine dipstick SMALL (A) NEGATIVE   Bilirubin Urine SMALL (A) NEGATIVE   Ketones, ur >80 (A) NEGATIVE mg/dL   Protein, ur TRACE (A) NEGATIVE mg/dL   Nitrite POSITIVE (A) NEGATIVE   Leukocytes, UA NEGATIVE NEGATIVE  Urine microscopic-add on     Status: Abnormal   Collection Time: 06/10/16 10:35 AM  Result Value Ref Range   Squamous Epithelial / LPF 6-30 (A) NONE SEEN   WBC, UA 6-30 0 - 5 WBC/hpf   RBC / HPF 0-5 0 - 5 RBC/hpf   Bacteria, UA MANY (A) NONE SEEN   Urine-Other MUCOUS PRESENT     Dg Elbow Complete Right  06/10/2016  CLINICAL DATA:  Cellulitis with limited ability to flex and extend. No history of trauma EXAM: RIGHT ELBOW - COMPLETE 3+ VIEW COMPARISON:  None. FINDINGS: Frontal, lateral, and bilateral oblique views  were obtained. There is persistent flexion throughout the study; patient is apparently unable to extend elbow. There is no demonstrable fracture or dislocation. No joint effusion. There is no appreciable joint space narrowing or erosion. IMPRESSION: Persistent flexion at the elbow joint. No fracture or dislocation. No arthropathic change noted. Electronically Signed   By: Lowella Grip III M.D.   On: 06/10/2016 11:48     A comprehensive review of systems was negative except for: Constitutional: positive for chills Gastrointestinal: positive for vomiting Review of Systems: No fevers, night sweats, chest pain, shortness of breath, nausea, diarrhea, constipation, easy bleeding or bruising, headaches, dizziness, vision changes, fainting.   Blood pressure 137/74, pulse 79, temperature 98.6 F (37 C), temperature source Oral, resp. rate 18, last menstrual period 05/31/2016, SpO2 100 %.  General appearance: alert, cooperative and appears stated age Head: Normocephalic, without obvious abnormality, atraumatic Neck: supple, symmetrical, trachea midline Extremities: Intact sensation and capillary refill all digits.  +epl/fpl/io.  No wounds. Swelling and erythema medial side of right elbow/arm.  Mildly indurated. Pulses: 2+ and symmetric Skin: Skin color, texture, turgor normal. No rashes or lesions Neurologic: Grossly normal Incision/Wound: none  Assessment/Plan Right arm abscess.  Recommend OR for incision and drainage.  Risks, benefits, and alternatives of surgery were discussed and the patient agrees with the plan of care.   Delrose Rohwer R 06/10/2016, 5:48 PM

## 2016-06-10 NOTE — Transfer of Care (Signed)
Immediate Anesthesia Transfer of Care Note  Patient: Bonnie PicklesVeronica L Langham  Procedure(s) Performed: Procedure(s): IRRIGATION AND DEBRIDEMENT ARM (Right)  Patient Location: PACU  Anesthesia Type:General  Level of Consciousness: awake, alert  and oriented  Airway & Oxygen Therapy: Patient Spontanous Breathing  Post-op Assessment: Report given to RN and Post -op Vital signs reviewed and stable  Post vital signs: Reviewed and stable  Last Vitals:  Filed Vitals:   06/10/16 1413 06/10/16 1529  BP: 122/73 137/74  Pulse: 83 79  Temp: 36.6 C 37 C  Resp: 16 18    Last Pain:  Filed Vitals:   06/10/16 1835  PainSc: 8       Patients Stated Pain Goal: 3 (06/10/16 1636)  Complications: No apparent anesthesia complications

## 2016-06-10 NOTE — Brief Op Note (Signed)
06/10/2016  8:31 PM  PATIENT:  Bonnie Yang  30 y.o. female  PRE-OPERATIVE DIAGNOSIS:  abscess  POST-OPERATIVE DIAGNOSIS:  Right arm abscess  PROCEDURE:  Procedure(s): IRRIGATION AND DEBRIDEMENT ARM (Right)  SURGEON:  Surgeon(s) and Role:    * Betha LoaKevin Jeanene Mena, MD - Primary  PHYSICIAN ASSISTANT:   ASSISTANTS: none   ANESTHESIA:   general  EBL:     BLOOD ADMINISTERED:none  DRAINS: iodoform packing  LOCAL MEDICATIONS USED:  MARCAINE     SPECIMEN:  Source of Specimen:  right arm  DISPOSITION OF SPECIMEN:  micro  COUNTS:  YES  TOURNIQUET:   Total Tourniquet Time Documented: Upper Arm (Right) - 36 minutes Total: Upper Arm (Right) - 36 minutes   DICTATION: .Other Dictation: Dictation Number [id number]  PLAN OF CARE: Discharge to home after PACU  PATIENT DISPOSITION:  PACU - hemodynamically stable.   Delay start of Pharmacological VTE agent (>24hrs) due to surgical blood loss or risk of bleeding: no

## 2016-06-10 NOTE — Progress Notes (Signed)
Pharmacy Antibiotic Note  Bonnie Yang is a 30 y.o. female admitted on 06/10/2016 with cellulitis.  Pharmacy has been consulted for Vancomycin dosing.  Plan: Vancomycin 1 GM IV loading dose, then Vancomycin 750 IV every 12 hours.  Goal trough 10-15 mcg/mL.  Monitor renal function Labs per protocol F/U updated weight     Temp (24hrs), Avg:97.7 F (36.5 C), Min:97.7 F (36.5 C), Max:97.7 F (36.5 C)   Recent Labs Lab 06/10/16 0911  WBC 23.0*  CREATININE 0.71    CrCl cannot be calculated (Unknown ideal weight.).    Allergies  Allergen Reactions  . Morphine And Related Hives    Antimicrobials this admission: Vancomycin 7/8 >>      Dose adjustments this admission: None    Thank you for allowing pharmacy to be a part of this patient's care.  Josephine Igoittman, Mahin Guardia Bennett 06/10/2016 1:19 PM

## 2016-06-10 NOTE — ED Notes (Signed)
Report given to [facility]. ETA 25 minutes.

## 2016-06-10 NOTE — ED Notes (Signed)
[facility] at bedside for transport. 

## 2016-06-10 NOTE — H&P (Addendum)
History and Physical  Bonnie Yang NWG:[id number] DOB: 17-May-1986 DOA: 06/10/2016  PCP: No PCP Per Patient  Patient coming from: home  Chief Complaint: right arm pain, middle back pain, ankle numbness  HPI:  30 year old woman presented with worsening right elbow and arm pain onset 7/5. WBC 23, severe pain with any attempted flexion or extension of the elbow and wrist, reported heroin use, concern for deep tissue infection. Started on antibiotics, transferred to [facility] for orthopedic/hand surgical consultation.  Symptoms began 7/5 while at work. They have progressed over the last several days with severe pain with any attempt to move the elbow, wrist or fingers. No alleviating factors other than rest. 7/3 someone injected heroin at the site. Also used cocaine that day. She is not certain that the needle was clean  In the emergency department: Afebrile, vital signs stable, no hypoxia. Elbow x-ray unremarkable. Exam concerning for deep tissue infection, recommended urgent orthopedic evaluation/hand surgery evaluation not available at this institution. Pertinent labs: Basic metabolic panel unremarkable, WBC 23 Imaging: Right elbow x-ray unremarkable.  Review of Systems:  Negative for  visual changes, sore throat, rash, chest pain, SOB, bleeding, v/abdominal pain.  History reviewed. No pertinent past medical history.  No medical problems  Past Surgical History  Procedure Laterality Date  . Abcess drainage    nose   reports that she has been smoking Cigarettes.  She has been smoking about 0.50 packs per day. She does not have any smokeless tobacco history on file. She reports that she drinks alcohol. She reports that she uses illicit drugs (Marijuana). Ambulatory status: no assistance required  Allergies  Allergen Reactions  . Morphine And Related Hives    Family History  Problem Relation Age of Onset  . Hypertension Mother   . Heart failure Mother      Prior to Admission  medications   Not on File    Physical Exam: Filed Vitals:   06/10/16 0744 06/10/16 0945 06/10/16 1146  BP: 110/90 127/82 132/73  Pulse: 88 91 91  Temp: 97.7 F (36.5 C)    TempSrc: Oral    Resp: SpO2: 97% 99% 100%   Constitutional:  . Appears calm, but uncomfortable. Non-toxic Eyes:  . PERRL and irises appear normal . Normal conjunctivae and lids ENMT:  . external ears, nose appear normal . grossly normal hearing . Lips appear normal . Oropharynx: mucosa, tongue appear normal Neck:  . neck appears normal, no masses . no thyromegaly Respiratory:  . CTA bilaterally, no w/r/r.  . Respiratory effort normal. No retractions or accessory muscle use Cardiovascular:  . RRR, no m/r/g . No LE extremity edema   . Normal pedal pulses Abdomen:  . Abdomen appears normal; no tenderness or masses Musculoskeletal:  . Bilateral lower extremity and LUE strength and tone normal . Sensation normalRight hand, bilateral feet . DP pulses 2+ bilaterally . The right elbow is mildly tender, no edema or erythema. ROM is extremely limited, unable to flex or extend at the elbow, keeps arm in 90 degree position. Exquisite pain with attempted manipulation of the wrist, fingers or elbow, especially with attempt at extension. Right radial pulse 2+. Sensation and intact. No pallor. No reported. ST changes. Skin:  . Erythema over right medial malleolus but otherwise unremarkable . 10 cm oval patch of erythema round the right AC fossa. Exquisitely tender. No fluctuance or wound or drainage noted.  Neurologic:  . Grossly normal Psychiatric:  . judgement and insight appear normal .  Mental status  o Mood, affect appropriate  Wt Readings from Last 3 Encounters:  01/30/16 49.896 kg (110 lb)  04/02/15 44.044 kg (97 lb 1.6 oz)  09/04/13 48.081 kg (106 lb)    I have personally reviewed following labs and imaging studies  Labs on Admission:  CBC:  Recent Labs Lab 06/10/16 0911  WBC  23.0*  NEUTROABS 19.6*  HGB 15.1*  HCT 44.8  MCV 85.3  PLT 235   Basic Metabolic Panel:  Recent Labs Lab 06/10/16 0911  NA 135  K 3.8  CL 101  CO2 24  GLUCOSE 103*  BUN 9  CREATININE 0.71  CALCIUM 8.7*   Urine analysis:    Component Value Date/Time   COLORURINE AMBER* 06/10/2016 1035   APPEARANCEUR HAZY* 06/10/2016 1035   LABSPEC >1.030* 06/10/2016 1035   PHURINE 5.5 06/10/2016 1035   GLUCOSEU NEGATIVE 06/10/2016 1035   HGBUR SMALL* 06/10/2016 1035   BILIRUBINUR SMALL* 06/10/2016 1035   KETONESUR >80* 06/10/2016 1035   PROTEINUR TRACE* 06/10/2016 1035   UROBILINOGEN 0.2 09/04/2013 2138   NITRITE POSITIVE* 06/10/2016 1035   LEUKOCYTESUR NEGATIVE 06/10/2016 1035   Radiological Exams on Admission: Dg Elbow Complete Right  06/10/2016  CLINICAL DATA:  Cellulitis with limited ability to flex and extend. No history of trauma EXAM: RIGHT ELBOW - COMPLETE 3+ VIEW COMPARISON:  None. FINDINGS: Frontal, lateral, and bilateral oblique views were obtained. There is persistent flexion throughout the study; patient is apparently unable to extend elbow. There is no demonstrable fracture or dislocation. No joint effusion. There is no appreciable joint space narrowing or erosion. IMPRESSION: Persistent flexion at the elbow joint. No fracture or dislocation. No arthropathic change noted. Electronically Signed   By: Bretta BangWilliam  Woodruff III M.D.   On: 06/10/2016 11:48    Principal Problem:   Cellulitis of elbow Active Problems:   UTI (lower urinary tract infection)   Heroin use   Assessment/Plan 1. Cellulitis right upper extremity AC fossa with severe pain with flexion/extension of the elbow and wrist concerning for purulent tenosynovitis in the context of heroin injection 7/3. No evidence of compartment syndrome presently. 2. Reported numbness bilateral ankles, no objective findings. Follow clinically. 3. UTI, symptomatic. 4. Tobacco use disorder 5. Heroin, cocaine use 7/3, ongoing  marijuana use. Not discussed with family.   IV antibiotics, NPO, pain control, Recommend urgent orthopedic evaluation, note available at this institution. Dr. Adriana Simasook discussed with hand surgery [facility], will see in consult.  MRI elbow with/without contrast per radiology, orthopedic request.  Discouraged drug use   Screen HIV, hepatitis  DVT prophylaxis: SCDs Code Status: full Family Communication:  Drug use not discussed with family. Disposition Plan: Transfer to higher level care Consults called: hand surgery per Dr. Adriana Simasook Admission status: admit   Time spent: 55 minutes  Brendia Sacksaniel Goodrich, MD  [facility] Direct contact: [phone] --Via amion app OR  --[email or web address]; password [email or web address]  7PM-7AM contact night coverage as above  06/10/2016, 12:32 PM  By signing my name below, I, Adron BeneGreylon Gawaluck, attest that this documentation has been prepared under the direction and in the presence of Daniel P. Irene LimboGoodrich, MD. Electronically Signed: Adron BeneGreylon Gawaluck, Scribe.  06/10/2016      I personally performed the services described in this documentation. All medical record entries made by the scribe were at my direction. I have reviewed the chart and agree that the record reflects my personal performance and is accurate and complete. Brendia Sacksaniel Goodrich, MD

## 2016-06-10 NOTE — ED Notes (Signed)
Patient left ED at this time with [facility]. No distress.

## 2016-06-10 NOTE — ED Notes (Signed)
Pt comes in for multiple problems. Pt states her right arm has been hurting since Wednesday. 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 She states her middle back is hurting as well with this starting yesterday. Denies any urinary problems. Pt also states that both of her feet are numb from her ankles down. Pt is able to walk and move feet appropriately.

## 2016-06-10 NOTE — Progress Notes (Signed)
Pharmacy Antibiotic Note  Bonnie Yang is a 30 y.o. female admitted on 06/10/2016 with cellulitis.  Pharmacy has been consulted for Vancomycin/Unasyn dosing. WBC elevated. S/P I&D in the OR with Dr. Merlyn LotKuzma.   Plan: -Vancomycin already ordered previously  -Unasyn 3g IV q8h -Trend WBC, temp, renal function  -Drug levels as indicated   Height: 5\' 5"  (165.1 cm) Weight: 120 lb (54.432 kg) IBW/kg (Calculated) : 57  Temp (24hrs), Avg:98.3 F (36.8 C), Min:97.7 F (36.5 C), Max:98.9 F (37.2 C)   Recent Labs Lab 06/10/16 0911  WBC 23.0*  CREATININE 0.71    Estimated Creatinine Clearance: 86.7 mL/min (by C-G formula based on Cr of 0.71).    Allergies  Allergen Reactions  . Morphine And Related Hives   Abran DukeLedford, Jessee Newnam 06/10/2016 9:46 PM

## 2016-06-10 NOTE — Anesthesia Postprocedure Evaluation (Signed)
Anesthesia Post Note  Patient: Bonnie PicklesVeronica L Yang  Procedure(s) Performed: Procedure(s) (LRB): IRRIGATION AND DEBRIDEMENT ARM (Right)  Patient location during evaluation: PACU Anesthesia Type: General Level of consciousness: awake, awake and alert and oriented Pain management: pain level controlled Vital Signs Assessment: post-procedure vital signs reviewed and stable Respiratory status: spontaneous breathing, nonlabored ventilation and respiratory function stable Cardiovascular status: blood pressure returned to baseline Anesthetic complications: no    Last Vitals:  Filed Vitals:   06/10/16 2123 06/10/16 2148  BP: 112/76 123/72  Pulse: 88 87  Temp: 37.2 C 37.4 C  Resp: 18 18    Last Pain:  Filed Vitals:   06/10/16 2149  PainSc: 7                  Laylee Schooley COKER

## 2016-06-10 NOTE — ED Provider Notes (Addendum)
History  By signing my name below, I, Earmon PhoenixJennifer Waddell, attest that this documentation has been prepared under the direction and in the presence of Donnetta HutchingBrian Jesselle Laflamme, MD. Electronically Signed: Earmon PhoenixJennifer Waddell, ED Scribe. 06/10/2016. 10:46 AM  Chief Complaint  Patient presents with  . Arm Pain  . Numbness   The history is provided by the patient and medical records. No language interpreter was used.    HPI Comments:  Bonnie Yang is a 30 y.o. female who presents to the Emergency Department complaining of worsening right arm pain that began three days ago. She describes the pain as soreness. 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 Pt also complains numbness in her ankles. She also states there are lots of mosquitos at her house as well. Touching and moving the RUE at the elbow increases the pain. She denies alleviating factors. She denies any needle sticks or drug use. She denies fever, chills, nausea, vomiting.   History reviewed. No pertinent past medical history. Past Surgical History  Procedure Laterality Date  . Abcess drainage     Family History  Problem Relation Age of Onset  . Hypertension Mother   . Heart failure Mother    Social History  Substance Use Topics  . Smoking status: Current Every Day Smoker -- 0.50 packs/day    Types: Cigarettes  . Smokeless tobacco: None  . Alcohol Use: Yes     Comment: 1I beer ocasionally   OB History    No data available     Review of Systems A complete 10 system review of systems was obtained and all systems are negative except as noted in the HPI and PMH.   Allergies  Morphine and related  Home Medications   Prior to Admission medications   Not on File   Triage Vitals: BP 110/90 mmHg  Pulse 88  Temp(Src) 97.7 F (36.5 C) (Oral)  Resp 16  SpO2 97%  LMP 05/31/2016 Physical Exam  Constitutional: She is oriented to person, place, and time. She appears well-developed and  well-nourished.  HENT:  Head: Normocephalic and atraumatic.  Eyes: Conjunctivae are normal.  Neck: Neck supple.  Cardiovascular: Normal rate and regular rhythm.   Pulmonary/Chest: Effort normal and breath sounds normal.  Abdominal: Soft. Bowel sounds are normal.  Musculoskeletal: Normal range of motion.  Neurological: She is alert and oriented to person, place, and time.  Skin: Skin is warm and dry. There is erythema.  Distal medial humerus with area of erythema about 5 cm in diameter. Severe pain with flexion and extension of the right elbow. Discomfort extends into proximal right forearm.  Psychiatric: She has a normal mood and affect. Her behavior is normal.  Nursing note and vitals reviewed.   ED Course  Procedures (including critical care time) DIAGNOSTIC STUDIES: Oxygen Saturation is 97% on RA, normal by my interpretation.   COORDINATION OF CARE: 8:19 AM- Will order IV antibiotics and labs. Pt verbalizes understanding and agrees to plan.  Medications  sodium chloride 0.9 % bolus 500 mL (0 mLs Intravenous Stopped 06/10/16 1000)  vancomycin (VANCOCIN) IVPB 1000 mg/200 mL premix (0 mg Intravenous Stopped 06/10/16 1020)  ondansetron (ZOFRAN) injection 4 mg (4 mg Intravenous Given 06/10/16 0927)  fentaNYL (SUBLIMAZE) injection 50 mcg (50 mcg Intravenous Given 06/10/16 1034)    Labs Review Labs Reviewed  CBC WITH DIFFERENTIAL/PLATELET - Abnormal; Notable for the following:    WBC 23.0 (*)    RBC 5.25 (*)  Hemoglobin 15.1 (*)    Neutro Abs 19.6 (*)    Monocytes Absolute 2.3 (*)    All other components within normal limits  BASIC METABOLIC PANEL - Abnormal; Notable for the following:    Glucose, Bld 103 (*)    Calcium 8.7 (*)    All other components within normal limits    Imaging Review No results found. I have personally reviewed and evaluated these images and lab results as part of my medical decision-making.   EKG Interpretation None     CRITICAL CARE Performed by:  Donnetta Hutching Total critical care time: 30 minutes Critical care time was exclusive of separately billable procedures and treating other patients. Critical care was necessary to treat or prevent imminent or life-threatening deterioration. Critical care was time spent personally by me on the following activities: development of treatment plan with patient and/or surrogate as well as nursing, discussions with consultants, evaluation of patient's response to treatment, examination of patient, obtaining history from patient or surrogate, ordering and performing treatments and interventions, ordering and review of laboratory studies, ordering and review of radiographic studies, pulse oximetry and re-evaluation of patient's condition. MDM   Final diagnoses:  Cellulitis of right upper extremity    Patient has obvious cellulitis of soft tissue of distal humerus. She has pain with flexion and extension at the elbow. I'm concerned about a component of tenosynovitis extending into the proximal forearm. IV vancomycin. Discussed with general medicine.  1330:   Further discussion with [1]  Dr. Ophelia Charter, general orthopedics. He recommended an MRI of the elbow to assess abscess versus cellulitis.   His opinion was that an abscess should be examined by an upper extremity specialist.  [2]   Additional discussions with Dr. Irene Limbo.  He suggested I consult hand surgery.   [3]  Discussion with Dr. Betha Loa, hand surgeon.  He recommended transfer to [facility] for further evaluation. He will consult on the patient.   I personally performed the services described in this documentation, which was scribed in my presence. The recorded information has been reviewed and is accurate.      Donnetta Hutching, MD 06/10/16 1050  Donnetta Hutching, MD 06/10/16 [phone]

## 2016-06-10 NOTE — Op Note (Signed)
[id number] 

## 2016-06-10 NOTE — Anesthesia Preprocedure Evaluation (Addendum)
Anesthesia Evaluation  Patient identified by MRN, date of birth, ID band Patient awake    Reviewed: Allergy & Precautions, NPO status , Patient's Chart, lab work & pertinent test results  Airway Mallampati: II  TM Distance: >3 FB Neck ROM: Full    Dental  (+) Teeth Intact, Dental Advisory Given   Pulmonary Current Smoker,    breath sounds clear to auscultation       Cardiovascular  Rhythm:Regular Rate:Normal     Neuro/Psych    GI/Hepatic   Endo/Other    Renal/GU      Musculoskeletal   Abdominal   Peds  Hematology   Anesthesia Other Findings   Reproductive/Obstetrics                            Anesthesia Physical Anesthesia Plan  ASA: II and emergent  Anesthesia Plan: General   Post-op Pain Management:    Induction: Intravenous  Airway Management Planned: LMA  Additional Equipment:   Intra-op Plan:   Post-operative Plan:   Informed Consent: I have reviewed the patients History and Physical, chart, labs and discussed the procedure including the risks, benefits and alternatives for the proposed anesthesia with the patient or authorized representative who has indicated his/her understanding and acceptance.   Dental advisory given  Plan Discussed with: CRNA and Anesthesiologist  Anesthesia Plan Comments:         Anesthesia Quick Evaluation

## 2016-06-11 ENCOUNTER — Encounter (HOSPITAL_COMMUNITY): Payer: Self-pay | Admitting: Family Medicine

## 2016-06-11 DIAGNOSIS — D72829 Elevated white blood cell count, unspecified: Secondary | ICD-10-CM

## 2016-06-11 DIAGNOSIS — Z720 Tobacco use: Secondary | ICD-10-CM

## 2016-06-11 DIAGNOSIS — N390 Urinary tract infection, site not specified: Secondary | ICD-10-CM

## 2016-06-11 DIAGNOSIS — F1110 Opioid abuse, uncomplicated: Secondary | ICD-10-CM

## 2016-06-11 DIAGNOSIS — R768 Other specified abnormal immunological findings in serum: Secondary | ICD-10-CM | POA: Diagnosis present

## 2016-06-11 DIAGNOSIS — L03119 Cellulitis of unspecified part of limb: Secondary | ICD-10-CM

## 2016-06-11 DIAGNOSIS — F1910 Other psychoactive substance abuse, uncomplicated: Secondary | ICD-10-CM | POA: Diagnosis present

## 2016-06-11 LAB — CBC
HCT: 40.1 % (ref 36.0–46.0)
Hemoglobin: 13.1 g/dL (ref 12.0–15.0)
MCH: 28.1 pg (ref 26.0–34.0)
MCHC: 32.7 g/dL (ref 30.0–36.0)
MCV: 86.1 fL (ref 78.0–100.0)
PLATELETS: 239 10*3/uL (ref 150–400)
RBC: 4.66 MIL/uL (ref 3.87–5.11)
RDW: 14.5 % (ref 11.5–15.5)
WBC: 21.7 10*3/uL — ABNORMAL HIGH (ref 4.0–10.5)

## 2016-06-11 LAB — COMMENT2 - HEP PANEL

## 2016-06-11 LAB — BASIC METABOLIC PANEL
ANION GAP: 8 (ref 5–15)
CHLORIDE: 103 mmol/L (ref 101–111)
CO2: 22 mmol/L (ref 22–32)
Calcium: 8.5 mg/dL — ABNORMAL LOW (ref 8.9–10.3)
Creatinine, Ser: 0.72 mg/dL (ref 0.44–1.00)
Glucose, Bld: 131 mg/dL — ABNORMAL HIGH (ref 65–99)
POTASSIUM: 3.9 mmol/L (ref 3.5–5.1)
SODIUM: 133 mmol/L — AB (ref 135–145)

## 2016-06-11 LAB — HEPATITIS B SURFACE ANTIBODY,QUALITATIVE: Hep B S Ab: REACTIVE

## 2016-06-11 LAB — HEPATITIS C ANTIBODY (REFLEX)

## 2016-06-11 LAB — HIV ANTIBODY (ROUTINE TESTING W REFLEX): HIV SCREEN 4TH GENERATION: NONREACTIVE

## 2016-06-11 LAB — HEPATITIS B CORE ANTIBODY, IGM: HEP B C IGM: NEGATIVE

## 2016-06-11 MED ORDER — PNEUMOCOCCAL VAC POLYVALENT 25 MCG/0.5ML IJ INJ
0.5000 mL | INJECTION | INTRAMUSCULAR | Status: DC
  Filled 2016-06-11: qty 0.5

## 2016-06-11 MED ORDER — FENTANYL CITRATE (PF) 100 MCG/2ML IJ SOLN
50.0000 ug | INTRAMUSCULAR | Status: DC | PRN
  Administered 2016-06-11 – 2016-06-12 (×6): 50 ug via INTRAVENOUS
  Filled 2016-06-11 (×6): qty 2

## 2016-06-11 MED ORDER — SENNOSIDES-DOCUSATE SODIUM 8.6-50 MG PO TABS
2.0000 | ORAL_TABLET | Freq: Two times a day (BID) | ORAL | Status: DC
  Administered 2016-06-11 – 2016-06-12 (×4): 2 via ORAL
  Filled 2016-06-11 (×4): qty 2

## 2016-06-11 NOTE — Progress Notes (Signed)
PROGRESS NOTE    TANISE RUSSMAN  WUJ:[id number]  DOB: [birth year]  DOA: 06/10/2016 PCP: No PCP Per Patient   Hospital course: 30 yo rhd female present with family members states she has been having increasing pain, swelling, erythema of the right elbow/arm for 3 days. This is a throbbing pain that she rates at 10/10. It is worsened with movement and alleviated with immobilization. She has had chills and vomiting.  She diagnosed with Right arm abscess.  Pt was taken to OR 7/9 for I&D in 2 places.   Assessment & Plan:   1. Right arm abscess s/p I&D 7/9 - post-op care per ortho team.  Continue IV antibiotics for now.  Cultures pending.  2. Symptomatic UTI -  UC in process.  Continue antibiotics.  3. Polysubstance abuse and IVDA - monitor for withdrawal. On fentanyl for pain per surgeon.  Check TTE for vegetations. HIV, Hep pending.  4. Tobacco use- nicotine patch.  DVT prophylaxis: SCDs Code Status: full Family Communication: Drug use not discussed with family. Disposition Plan: UTD Consults called: hand surgery Admission status: admit   Procedures:  I&D 7/9  Antimicrobials: Anti-infectives    Start     Dose/Rate Route Frequency Ordered Stop   06/10/16 2200  Ampicillin-Sulbactam (UNASYN) 3 g in sodium chloride 0.9 % 100 mL IVPB     3 g 100 mL/hr over 60 Minutes Intravenous Every 8 hours 06/10/16 2151     06/10/16 2100  vancomycin (VANCOCIN) IVPB 750 mg/150 ml premix     750 mg 150 mL/hr over 60 Minutes Intravenous Every 12 hours 06/10/16 1319     06/10/16 1645  sulfamethoxazole-trimethoprim (BACTRIM DS,SEPTRA DS) 800-160 MG per tablet 1 tablet  Status:  Discontinued     1 tablet Oral Every 12 hours 06/10/16 1637 06/10/16 2147   06/10/16 0900  vancomycin (VANCOCIN) IVPB 1000 mg/200 mL premix     1,000 mg 200 mL/hr over 60 Minutes Intravenous  Once 06/10/16 0855 06/10/16 1020      Subjective: Pt is recovering from surgery.   Objective: Filed Vitals:   06/10/16 2115  06/10/16 2123 06/10/16 2148 06/11/16 0500  BP:  112/76 123/72 109/71  Pulse: 87 88 87 79  Temp:  98.9 F (37.2 C) 99.3 F (37.4 C) 98.4 F (36.9 C)  TempSrc:   Oral Oral  Resp: Height:      Weight:      SpO2: 96% 98% 99% 98%    Intake/Output Summary (Last 24 hours) at 06/11/16 0730 Last data filed at 06/11/16 0231  Gross per 24 hour  Intake    800 ml  Output      8 ml  Net    792 ml   Filed Weights   06/10/16 1700  Weight: 120 lb (54.432 kg)    Exam:  General exam: no apparent distress Respiratory system: Clear. No increased work of breathing. Cardiovascular system: S1 & S2 heard.  Gastrointestinal system: Abdomen is nondistended, soft and nontender. Normal bowel sounds heard. Central nervous system: No focal neurological deficits. Extremities: wound clean and dry bandaged.   Data Reviewed: Basic Metabolic Panel:  Recent Labs Lab 06/10/16 0911 06/10/16 2209  NA 135  --   K 3.8  --   CL 101  --   CO2 24  --   GLUCOSE 103*  --   BUN 9  --   CREATININE 0.71 0.74  CALCIUM 8.7*  --    Liver Function Tests:  No results for input(s): AST, ALT, ALKPHOS, BILITOT, PROT, ALBUMIN in the last 168 hours. No results for input(s): LIPASE, AMYLASE in the last 168 hours. No results for input(s): AMMONIA in the last 168 hours. CBC:  Recent Labs Lab 06/10/16 0911 06/10/16 2209 06/11/16 0552  WBC 23.0* 24.5* 21.7*  NEUTROABS 19.6*  --   --   HGB 15.1* 13.1 13.1  HCT 44.8 39.3 40.1  MCV 85.3 85.6 86.1  PLT 235 225 239   Cardiac Enzymes: No results for input(s): CKTOTAL, CKMB, CKMBINDEX, TROPONINI in the last 168 hours. BNP (last 3 results) No results for input(s): PROBNP in the last 8760 hours. CBG: No results for input(s): GLUCAP in the last 168 hours.  No results found for this or any previous visit (from the past 240 hour(s)).   Studies: Dg Elbow Complete Right  06/10/2016  CLINICAL DATA:  Cellulitis with limited ability to flex and extend. No  history of trauma EXAM: RIGHT ELBOW - COMPLETE 3+ VIEW COMPARISON:  None. FINDINGS: Frontal, lateral, and bilateral oblique views were obtained. There is persistent flexion throughout the study; patient is apparently unable to extend elbow. There is no demonstrable fracture or dislocation. No joint effusion. There is no appreciable joint space narrowing or erosion. IMPRESSION: Persistent flexion at the elbow joint. No fracture or dislocation. No arthropathic change noted. Electronically Signed   By: Bretta BangWilliam  Woodruff III M.D.   On: 06/10/2016 11:48     Scheduled Meds: . ampicillin-sulbactam (UNASYN) IV  3 g Intravenous Q8H  . enoxaparin (LOVENOX) injection  40 mg Subcutaneous Q24H  . nicotine  21 mg Transdermal Daily  . vancomycin  750 mg Intravenous Q12H  . vitamin C  1,000 mg Oral Daily   Continuous Infusions: . sodium chloride 75 mL/hr at 06/10/16 1522  . lactated ringers 100 mL/hr (06/10/16 2257)   Principal Problem:   Cellulitis of elbow Active Problems:   UTI (lower urinary tract infection)   Heroin use  Time spent:   Standley Dakinslanford Navraj Dreibelbis, MD, FAAFP [facility] Pager [phone]  If 7PM-7AM, please contact night-coverage [email or web address] Password [email or web address] 06/11/2016, 7:30 AM    LOS: 1 day

## 2016-06-11 NOTE — Op Note (Signed)
NAMAntionette Char:  Giuliano, Danissa                ACCOUNT NO.:  [id number]  MEDICAL RECORD NO.:  [id number]  LOCATION:  5N06C                        FACILITY:  [facility]  PHYSICIAN:  Betha LoaKevin Shankar Silber, MD        DATE OF BIRTH:  17-Feb-1986  DATE OF PROCEDURE:  06/10/2016 DATE OF DISCHARGE:                              OPERATIVE REPORT   PREOPERATIVE DIAGNOSIS:  Right arm abscess.  POSTOPERATIVE DIAGNOSIS:  Right arm abscess.  PROCEDURE:  Incision and drainage of right arm, including subfascial, in two locations.  SURGEON:  Betha LoaKevin Anterio Scheel, MD.  ASSISTANT:  None.  ANESTHESIA:  General.  IV FLUIDS:  Per anesthesia flow sheet.  ESTIMATED BLOOD LOSS:  Minimal.  COMPLICATIONS:  None.  SPECIMENS:  Cultures to Micro.  TOURNIQUET TIME:  36 minutes.  DISPOSITION:  Stable to PACU.  INDICATIONS:  Ms. Michelle PiperGuy is a 30 year old right-hand-dominant female who has been having increasing pain, swelling, and erythema of right elbow and upper arm for approximately three days.  She has had some chills. She was seen at an emergency department where she was felt to have an abscess.  [facility].  I recommended incision and drainage in the operating room.  Risks, benefits, and alternatives of surgery were discussed including risk of blood loss; infection; damage to nerves, vessels, tendons, ligaments, bone; failure of surgery; need for additional surgery; complications with wound healing; continued pain; continued infection; need for repeat irrigation and debridement.  She voiced understanding of these risks and elected to proceed.  OPERATIVE COURSE:  After being identified preoperatively by myself, the patient and I agreed upon procedure and site of procedure.  Surgical site marked.  Risks, benefits, and alternatives of surgery were reviewed, and she wished to proceed.  Surgical consent had been signed. She was given [facility] vancomycin at [facility].  Further antibiotics  were held for cultures.  She was transferred to the operating room and placed on the operating table in supine position with the right upper extremity on arm board.  General anesthesia was induced by Anesthesiology.  Right upper extremity was prepped and draped in normal sterile orthopedic fashion.  Surgical pause was performed between surgeons, Anesthesia, operating room staff; and all were in agreement as to the patient, procedure, and site of procedure.  Tourniquet at the proximal aspect of the arm was inflated to 250 mmHg after exsanguination of the limb with an Esmarch bandage.  An incision was made at the anteromedial side of the upper arm.  This was carried into subcutaneous tissues by spreading technique.  Bipolar electrocautery was used to obtain hemostasis.  An indurated area on the volar aspect of the forearm was entered.  There was thin purulent fluid here.  Cultures were taken for aerobes, anaerobes, and gram stain.  The muscle was identified.  The fascia was incised.  There did not appear to be any infection deep to the fascia.  The area posteriorly was inspected as well.  No gross purulence was encountered.  More distally, there was more indurated area, and this area was spread.  Again, no gross purulence was found. An additional incision was made in the  proximal forearm where another indurated area was felt.  The area deep to the fascia was entered. There was one moment where there appeared to be a small amount of cloudy purulent fluid, but no abscess cavity was found.  The forearm and upper arm were palpated, and no further indurated areas were felt.  The wounds were copiously irrigated with 3000 mL of sterile saline by cysto tubing. They were then packed with iodoform gauze.  They were injected with 10 mL of 0.25% plain Marcaine to aid in postoperative analgesia.  They were then dressed with sterile 4x4s and ABD and wrapped with a Kerlix bandage.  A posterior splint was  placed and wrapped with Kerlix and Ace bandage.  Tourniquet was deflated at 36 minutes.  Fingertips were pink with brisk capillary refill after deflation of the tourniquet. Operative drapes were broken down.  The patient was awoken from anesthesia safely.  She was transferred back to a stretcher and taken to PACU in stable condition.  She is admitted to the Hospitalist.  She will continue on IV antibiotics.  We will start hydrotherapy with packing changes in 2-3 days.     Betha Loa, MD     KK/MEDQ  D:  06/10/2016  T:  06/11/2016  Job:  [id number]

## 2016-06-12 ENCOUNTER — Ambulatory Visit (HOSPITAL_COMMUNITY): Payer: Self-pay

## 2016-06-12 ENCOUNTER — Encounter (HOSPITAL_COMMUNITY): Payer: Self-pay | Admitting: General Practice

## 2016-06-12 DIAGNOSIS — R894 Abnormal immunological findings in specimens from other organs, systems and tissues: Secondary | ICD-10-CM

## 2016-06-12 DIAGNOSIS — R509 Fever, unspecified: Secondary | ICD-10-CM

## 2016-06-12 DIAGNOSIS — F1910 Other psychoactive substance abuse, uncomplicated: Secondary | ICD-10-CM

## 2016-06-12 LAB — CBC WITH DIFFERENTIAL/PLATELET
BASOS PCT: 0 %
Basophils Absolute: 0 10*3/uL (ref 0.0–0.1)
EOS ABS: 0.3 10*3/uL (ref 0.0–0.7)
EOS PCT: 2 %
HCT: 38.9 % (ref 36.0–46.0)
Hemoglobin: 13.2 g/dL (ref 12.0–15.0)
LYMPHS ABS: 2 10*3/uL (ref 0.7–4.0)
Lymphocytes Relative: 14 %
MCH: 28.8 pg (ref 26.0–34.0)
MCHC: 33.9 g/dL (ref 30.0–36.0)
MCV: 84.7 fL (ref 78.0–100.0)
MONO ABS: 2.5 10*3/uL — AB (ref 0.1–1.0)
Monocytes Relative: 18 %
NEUTROS PCT: 66 %
Neutro Abs: 9.2 10*3/uL — ABNORMAL HIGH (ref 1.7–7.7)
PLATELETS: 239 10*3/uL (ref 150–400)
RBC: 4.59 MIL/uL (ref 3.87–5.11)
RDW: 14.5 % (ref 11.5–15.5)
WBC: 14 10*3/uL — AB (ref 4.0–10.5)

## 2016-06-12 LAB — COMPREHENSIVE METABOLIC PANEL
ALT: 716 U/L — AB (ref 14–54)
ANION GAP: 8 (ref 5–15)
AST: 211 U/L — ABNORMAL HIGH (ref 15–41)
Albumin: 2.9 g/dL — ABNORMAL LOW (ref 3.5–5.0)
Alkaline Phosphatase: 148 U/L — ABNORMAL HIGH (ref 38–126)
BUN: <5 mg/dL — ABNORMAL LOW (ref 6–20)
CHLORIDE: 106 mmol/L (ref 101–111)
CO2: 23 mmol/L (ref 22–32)
CREATININE: 0.75 mg/dL (ref 0.44–1.00)
Calcium: 8.7 mg/dL — ABNORMAL LOW (ref 8.9–10.3)
Glucose, Bld: 112 mg/dL — ABNORMAL HIGH (ref 65–99)
POTASSIUM: 3.5 mmol/L (ref 3.5–5.1)
SODIUM: 137 mmol/L (ref 135–145)
TOTAL PROTEIN: 6.4 g/dL — AB (ref 6.5–8.1)
Total Bilirubin: 1.4 mg/dL — ABNORMAL HIGH (ref 0.3–1.2)

## 2016-06-12 LAB — ECHOCARDIOGRAM COMPLETE
HEIGHTINCHES: 65 in
WEIGHTICAEL: 1920 [oz_av]

## 2016-06-12 MED ORDER — AMOXICILLIN-POT CLAVULANATE 875-125 MG PO TABS
1.0000 | ORAL_TABLET | Freq: Two times a day (BID) | ORAL | Status: DC
  Administered 2016-06-13: 1 via ORAL
  Filled 2016-06-12: qty 1

## 2016-06-12 MED ORDER — DOXYCYCLINE HYCLATE 100 MG PO TABS
100.0000 mg | ORAL_TABLET | Freq: Two times a day (BID) | ORAL | Status: DC
  Administered 2016-06-13: 100 mg via ORAL
  Filled 2016-06-12: qty 1

## 2016-06-12 MED ORDER — POLYETHYLENE GLYCOL 3350 17 G PO PACK: 17.0000 g | PACK | Freq: Every day | ORAL | Status: DC

## 2016-06-12 MED ORDER — OXYCODONE-ACETAMINOPHEN 5-325 MG PO TABS
1.0000 | ORAL_TABLET | ORAL | Status: DC | PRN
  Administered 2016-06-12 – 2016-06-13 (×5): 1 via ORAL
  Filled 2016-06-12 (×5): qty 1

## 2016-06-12 MED ORDER — POLYETHYLENE GLYCOL 3350 17 G PO PACK
17.0000 g | PACK | Freq: Two times a day (BID) | ORAL | Status: DC
  Administered 2016-06-12: 17 g via ORAL
  Filled 2016-06-12 (×2): qty 1

## 2016-06-12 NOTE — Progress Notes (Signed)
Pt informed that she is not to leave the floor without hospital chaperone or security. Pt informed that she is not to leave hospital without signing AMA form per physician order of today.

## 2016-06-12 NOTE — Discharge Instructions (Addendum)
PLEASE FOLLOW UP WITH DR Merlyn Lot OFFICE TO RESUME HYDROTHERAPY TREATMENTS AND WOUND CARE ESTABLISH PRIMARY CARE AS SOON AS POSSIBLE FOLLOW UP WITH GI SPECIALIST TO DISCUSS TREATMENT OPTIONS FOR HEP C.     Hepatitis C Hepatitis C is a liver infection. It is caused by a germ that can spread through blood and other bodily fluids. Your doctor will use blood and liver tests to:  Check for this infection.  Decide how to treat you.  Check your health after treatment. HOME CARE  Rest.  Do not take any medicine unless your doctor says it is okay. This includes over-the-counter medicine and birth control pills.  Do not drink alcohol.  Do not have sex until your doctor says it is okay.  Do not share toothbrushes, nail clippers, razors, or needles.  Take all medicines as told by your doctor. GET HELP IF:  You have a fever.  Your belly (abdomen) hurts.  Your pee (urine) is dark.  Your poop (bowel movement) is the color of clay.  You have joint pain. GET HELP RIGHT AWAY IF:  You feel more and more tired (fatigued).  You feel more and more weak.  You do not feel like eating.  You feel sick to your stomach (nauseous) or throw up (vomit).  Your skin or the whites of your eyes turn yellow (jaundice) or turn more yellow than they were before.  You bruise or bleed easily. MAKE SURE YOU:  Understand these instructions.  Will watch your condition.  Will get help right away if you are not doing well or get worse.   This information is not intended to replace advice given to you by your health care provider. Make sure you discuss any questions you have with your health care provider.   Document Released: 11/02/2008 Document Revised: 04/06/2015 Document Reviewed: 03/04/2014 Elsevier Interactive Patient Education 2016 Elsevier Inc.   Opioid Use Disorder Opioid use disorder is a mental disorder. It is the continued nonmedical use of opioids in spite of risks to health and  well-being. Misused opioids include the street drug heroin. They also include pain medicines such as morphine, hydrocodone, oxycodone, and fentanyl. Opioids are very addictive. People who misuse opioids get an exaggerated feeling of well-being. Opioid use disorder often disrupts activities at home, work, or school. It may cause mental or physical problems.  A family history of opioid use disorder puts you at higher risk of it. People with opioid use disorder often misuse other drugs or have mental illness such as depression, posttraumatic stress disorder, or antisocial personality disorder. They also are at risk of suicide and death from overdose. SIGNS AND SYMPTOMS  Signs and symptoms of opioid use disorder include:  Use of opioids in larger amounts or over a longer period than intended.  Unsuccessful attempts to cut down or control opioid use.  A lot of time spent obtaining, using, or recovering from the effects of opioids.  A strong desire or urge to use opioids (craving).  Continued use of opioids in spite of major problems at work, school, or home because of use.  Continued use of opioids in spite of relationship problems because of use.  Giving up or cutting down on important life activities because of opioid use.  Use of opioids over and over in situations when it is physically hazardous, such as driving a car.  Continued use of opioids in spite of a physical problem that is likely related to use. Physical problems can include:  Severe constipation.  Poor nutrition.  Infertility.  Tuberculosis.  Aspiration pneumonia.  Infections such as human immunodeficiency virus (HIV) and hepatitis (from injecting opioids).  Continued use of opioids in spite of a mental problem that is likely related to use. Mental problems can include:  Depression.  Anxiety.  Hallucinations.  Sleep problems.  Loss of sexual function.  Need to use more and more opioids to get the same effect,  or lessened effect over time with use of the same amount (tolerance).  Having withdrawal symptoms when opioid use is stopped, or using opioids to reduce or avoid withdrawal symptoms. Withdrawal symptoms include:  Depressed, anxious, or irritable mood.  Nausea, vomiting, diarrhea, or intestinal cramping.  Muscle aches or spasms.  Excessive tearing or runny nose.  Dilated pupils, sweating, or hairs standing on end.  Yawning.  Fever, raised blood pressure, or fast pulse.  Restlessness or trouble sleeping. This does not apply to people taking opioids for medical reasons only. DIAGNOSIS Opioid use disorder is diagnosed by your health care provider. You may be asked questions about your opioid use and and how it affects your life. A physical exam may be done. A drug screen may be ordered. You may be referred to a mental health professional. The diagnosis of opioid use disorder requires at least two symptoms within 12 months. The type of opioid use disorder you have depends on the number of signs and symptoms you have. The type may be:  Mild. Two or three signs and symptoms.   Moderate. Four or five signs and symptoms.   Severe. Six or more signs and symptoms. TREATMENT  Treatment is usually provided by mental health professionals with training in substance use disorders.The following options are available:  Detoxification.This is the first step in treatment for withdrawal. It is medically supervised withdrawal with the use of medicines. These medicines lessen withdrawal symptoms. They also raise the chance of becoming opioid free.  Counseling, also known as talk therapy. Talk therapy addresses the reasons you use opioids. It also addresses ways to keep you from using again (relapse). The goals of talk therapy are to avoid relapse by:  Identifying and avoiding triggers for use.  Finding healthy ways to cope with stress.  Learning how to handle cravings.  Support groups. Support  groups provide emotional support, advice, and guidance.  A medicine that blocks opioid receptors in your brain. This medicine can reduce opioid cravings that lead to relapse. This medicine also blocks the desired opioid effect when relapse occurs.  Opioids that are taken by mouth in place of the misused opioid (opioid maintenance treatment). These medicines satisfy cravings but are safer than commonly misused opioids. This often is the best option for people who continue to relapse with other treatments. HOME CARE INSTRUCTIONS   Take medicines only as directed by your health care provider.  Check with your health care provider before starting new medicines.  Keep all follow-up visits as directed by your health care provider. SEEK MEDICAL CARE IF:  You are not able to take your medicines as directed.  Your symptoms get worse. SEEK IMMEDIATE MEDICAL CARE IF:    You have serious thoughts about hurting yourself or others.  You may have taken an overdose of opioids. FOR MORE INFORMATION  National Institute on Drug Abuse: http://www.price-smith.com/  Substance Abuse and Mental Health Services Administration: SkateOasis.com.pt   This information is not intended to replace advice given to you by your health care provider. Make sure you discuss any questions you have with  your health care provider.   Document Released: 09/17/2007 Document Revised: 12/11/2014 Document Reviewed: 12/03/2013 Elsevier Interactive Patient Education 2016 Elsevier Inc. ---------------------------------------------------------------------------------------------------------------------------------------------------------------------------------------------------------------------- Doxycycline tablets or capsules  What is this medicine?  DOXYCYCLINE (dox i SYE kleen) is a tetracycline antibiotic. It kills certain bacteria or stops their growth. It is used to treat many kinds of infections, like dental, skin, respiratory, and  urinary tract infections. It also treats acne, Lyme disease, malaria, and certain sexually transmitted infections.  This medicine may be used for other purposes; ask your health care provider or pharmacist if you have questions.  What should I tell my health care provider before I take this medicine?  They need to know if you have any of these conditions:  -liver disease  -long exposure to sunlight like working outdoors  -stomach problems like colitis  -an unusual or allergic reaction to doxycycline, tetracycline antibiotics, other medicines, foods, dyes, or preservatives  -pregnant or trying to get pregnant  -breast-feeding  How should I use this medicine?  Take this medicine by mouth with a full glass of water. Follow the directions on the prescription label. It is best to take this medicine without food, but if it upsets your stomach take it with food. Take your medicine at regular intervals. Do not take your medicine more often than directed. Take all of your medicine as directed even if you think you are better. Do not skip doses or stop your medicine early.  Talk to your pediatrician regarding the use of this medicine in children. While this drug may be prescribed for selected conditions, precautions do apply.  Overdosage: If you think you have taken too much of this medicine contact a poison control center or emergency room at once.  NOTE: This medicine is only for you. Do not share this medicine with others.  What if I miss a dose?  If you miss a dose, take it as soon as you can. If it is almost time for your next dose, take only that dose. Do not take double or extra doses.  What may interact with this medicine?  -antacids  -barbiturates  -birth control pills  -bismuth subsalicylate  -carbamazepine  -methoxyflurane  -other antibiotics  -phenytoin  -vitamins that contain iron  -warfarin  This list may not describe all possible interactions. Give your health care provider a list of  all the medicines, herbs, non-prescription drugs, or dietary supplements you use. Also tell them if you smoke, drink alcohol, or use illegal drugs. Some items may interact with your medicine.  What should I watch for while using this medicine?  Tell your doctor or health care professional if your symptoms do not improve.  Do not treat diarrhea with over the counter products. Contact your doctor if you have diarrhea that lasts more than 2 days or if it is severe and watery.  Do not take this medicine just before going to bed. It may not dissolve properly when you lay down and can cause pain in your throat. Drink plenty of fluids while taking this medicine to also help reduce irritation in your throat.  This medicine can make you more sensitive to the sun. Keep out of the sun. If you cannot avoid being in the sun, wear protective clothing and use sunscreen. Do not use sun lamps or tanning beds/booths.  Birth control pills may not work properly while you are taking this medicine. Talk to your doctor about using an extra method of birth control.  If you are  being treated for a sexually transmitted infection, avoid sexual contact until you have finished your treatment. Your sexual partner may also need treatment.  Avoid antacids, aluminum, calcium, magnesium, and iron products for 4 hours before and 2 hours after taking a dose of this medicine.  If you are using this medicine to prevent malaria, you should still protect yourself from contact with mosquitos. Stay in screened-in areas, use mosquito nets, keep your body covered, and use an insect repellent.  What side effects may I notice from receiving this medicine?  Side effects that you should report to your doctor or health care professional as soon as possible:  -allergic reactions like skin rash, itching or hives, swelling of the face, lips, or tongue  -difficulty breathing  -fever  -itching in the rectal or genital area  -pain on swallowing    -redness, blistering, peeling or loosening of the skin, including inside the mouth  -severe stomach pain or cramps  -unusual bleeding or bruising  -unusually weak or tired  -yellowing of the eyes or skin  Side effects that usually do not require medical attention (report to your doctor or health care professional if they continue or are bothersome):  -diarrhea  -loss of appetite  -nausea, vomiting  This list may not describe all possible side effects. Call your doctor for medical advice about side effects. You may report side effects to FDA at 1-800-FDA-1088.  Where should I keep my medicine?  Keep out of the reach of children.  Store at room temperature, below 30 degrees C (86 degrees F). Protect from light. Keep container tightly closed. Throw away any unused medicine after the expiration date. Taking this medicine after the expiration date can make you seriously ill.  NOTE: This sheet is a summary. It may not cover all possible information. If you have questions about this medicine, talk to your doctor, pharmacist, or health care provider.   2016, Elsevier/Gold Standard. (2015-03-12 12:10:28)  ---------------------------------------------------------------------------------------------------------------------------------------------------------------------------------------------------------------------- Amoxicillin Clavulanic Acid (Augmentin) tablets  What is this medicine?  AMOXICILLIN CLAVULANIC ACID (a mox i SIL in KLAV yoo lan ic AS id) is a penicillin antibiotic. It is used to treat certain kinds of bacterial infections. It will not work for colds, flu, or other viral infections.  This medicine may be used for other purposes; ask your health care provider or pharmacist if you have questions.  What should I tell my health care provider before I take this medicine?  They need to know if you have any of these conditions:  -bowel disease, like colitis  -kidney disease  -liver disease   -mononucleosis  -an unusual or allergic reaction to amoxicillin, penicillin, cephalosporin, other antibiotics, clavulanic acid, other medicines, foods, dyes, or preservatives  -pregnant or trying to get pregnant  -breast-feeding  How should I use this medicine?  Take this medicine by mouth with a full glass of water. Follow the directions on the prescription label. Take at the start of a meal. Do not crush or chew. If the tablet has a score line, you may cut it in half at the score line for easier swallowing. Take your medicine at regular intervals. Do not take your medicine more often than directed. Take all of your medicine as directed even if you think you are better. Do not skip doses or stop your medicine early.  Talk to your pediatrician regarding the use of this medicine in children. Special care may be needed.  Overdosage: If you think you have taken too much of  this medicine contact a poison control center or emergency room at once.  NOTE: This medicine is only for you. Do not share this medicine with others.  What if I miss a dose?  If you miss a dose, take it as soon as you can. If it is almost time for your next dose, take only that dose. Do not take double or extra doses.  What may interact with this medicine?  -allopurinol  -anticoagulants  -birth control pills  -methotrexate  -probenecid  This list may not describe all possible interactions. Give your health care provider a list of all the medicines, herbs, non-prescription drugs, or dietary supplements you use. Also tell them if you smoke, drink alcohol, or use illegal drugs. Some items may interact with your medicine.  What should I watch for while using this medicine?  Tell your doctor or health care professional if your symptoms do not improve.  Do not treat diarrhea with over the counter products. Contact your doctor if you have diarrhea that lasts more than 2 days or if it is severe and watery.  If you have diabetes, you  may get a false-positive result for sugar in your urine. Check with your doctor or health care professional.  Birth control pills may not work properly while you are taking this medicine. Talk to your doctor about using an extra method of birth control.  What side effects may I notice from receiving this medicine?  Side effects that you should report to your doctor or health care professional as soon as possible:  -allergic reactions like skin rash, itching or hives, swelling of the face, lips, or tongue  -breathing problems  -dark urine  -fever or chills, sore throat  -redness, blistering, peeling or loosening of the skin, including inside the mouth  -seizures  -trouble passing urine or change in the amount of urine  -unusual bleeding, bruising  -unusually weak or tired  -white patches or sores in the mouth or throat  Side effects that usually do not require medical attention (report to your doctor or health care professional if they continue or are bothersome):  -diarrhea  -dizziness  -headache  -nausea, vomiting  -stomach upset  -vaginal or anal irritation  This list may not describe all possible side effects. Call your doctor for medical advice about side effects. You may report side effects to FDA at 1-800-FDA-1088.  Where should I keep my medicine?  Keep out of the reach of children.  Store at room temperature below 25 degrees C (77 degrees F). Keep container tightly closed. Throw away any unused medicine after the expiration date.  NOTE: This sheet is a summary. It may not cover all possible information. If you have questions about this medicine, talk to your doctor, pharmacist, or health care provider.   2016, Elsevier/Gold Standard. (2008-02-13 12:04:30) ----------------------------------------------------------------------------------------------------------------------------------------------------------------------------------------------------------------------

## 2016-06-12 NOTE — Progress Notes (Signed)
*  PRELIMINARY RESULTS* Echocardiogram 2D Echocardiogram has been performed.  Jeryl Columbialliott, Clotile Whittington 06/12/2016, 4:16 PM

## 2016-06-12 NOTE — Progress Notes (Addendum)
PROGRESS NOTE    Bonnie Yang  YNW:[id number]  DOB: 29-May-1986  DOA: 06/10/2016 PCP: No PCP Per Patient  Hospital course: 30 yo rhd female present with family members states she has been having increasing pain, swelling, erythema of the right elbow/arm for 3 days. This is a throbbing pain that she rates at 10/10. It is worsened with movement and alleviated with immobilization. She has had chills and vomiting.  She diagnosed with Right arm abscess.  Pt was taken to OR 7/9 for I&D in 2 places.   Assessment & Plan:   1. Right arm abscess s/p I&D 7/9 - post-op care per ortho team.  Continue IV antibiotics for now.  Cultures pending. Pt to have hydrotherapy in a couple of days per ortho team.  2. Symptomatic UTI -  UC positive for E. Coli.  C&S pending.  Continue antibiotics.  3. Polysubstance abuse and IVDA - monitor for withdrawal. On fentanyl for pain per surgeon.  Check TTE for vegetations. HIV NR, Hep C Ab positive.  Pt not allowed to leave room without security chaperone or other hospital personnel present.  4. Hep C Ab positive with elevated liver enzymes - Pt will need an outpatient GI appointment to address treatment options.  5. Tobacco use- nicotine patch.  DVT prophylaxis: SCDs Code Status: full Family Communication: Drug use not discussed with family. Disposition Plan: UTD Consults called: hand surgery Admission status: admit    Procedures:  I&D 7/9  Antimicrobials: Anti-infectives    Start     Dose/Rate Route Frequency Ordered Stop   06/10/16 2200  Ampicillin-Sulbactam (UNASYN) 3 g in sodium chloride 0.9 % 100 mL IVPB     3 g 100 mL/hr over 60 Minutes Intravenous Every 8 hours 06/10/16 2151     06/10/16 2100  vancomycin (VANCOCIN) IVPB 750 mg/150 ml premix     750 mg 150 mL/hr over 60 Minutes Intravenous Every 12 hours 06/10/16 1319     06/10/16 1645  sulfamethoxazole-trimethoprim (BACTRIM DS,SEPTRA DS) 800-160 MG per tablet 1 tablet  Status:  Discontinued     1  tablet Oral Every 12 hours 06/10/16 1637 06/10/16 2147   06/10/16 0900  vancomycin (VANCOCIN) IVPB 1000 mg/200 mL premix     1,000 mg 200 mL/hr over 60 Minutes Intravenous  Once 06/10/16 0855 06/10/16 1020      Subjective: RN reported to me that patient left hospital last night and returned altered and confused with blood shot eyes.    Objective: Filed Vitals:   06/11/16 0500 06/11/16 1318 06/11/16 2025 06/12/16 0504  BP: 109/71 119/71 126/75 103/59  Pulse: 79 81 94 81  Temp: 98.4 F (36.9 C) 98.4 F (36.9 C) 99.4 F (37.4 C) 98 F (36.7 C)  TempSrc: Oral Oral Oral Oral  Resp: Height:      Weight:      SpO2: 98% 98% 97% 98%    Intake/Output Summary (Last 24 hours) at 06/12/16 1020 Last data filed at 06/12/16 0505  Gross per 24 hour  Intake      0 ml  Output      2 ml  Net     -2 ml   Filed Weights   06/10/16 1700  Weight: 120 lb (54.432 kg)    Exam:  General exam: no apparent distress Respiratory system: Clear. No increased work of breathing. Cardiovascular system: S1 & S2 heard.  Gastrointestinal system: Abdomen is nondistended, soft and nontender. Normal bowel sounds heard. Central  nervous system: No focal neurological deficits. Extremities: wound clean and dry bandaged.   Data Reviewed: Basic Metabolic Panel:  Recent Labs Lab 06/10/16 0911 06/10/16 2209 06/11/16 1054 06/12/16 0227  NA 135  --  133* 137  K 3.8  --  3.9 3.5  CL 101  --  103 106  CO2 24  --  22 23  GLUCOSE 103*  --  131* 112*  BUN 9  --  <5* <5*  CREATININE 0.71 0.74 0.72 0.75  CALCIUM 8.7*  --  8.5* 8.7*   Liver Function Tests:  Recent Labs Lab 06/12/16 0227  AST 211*  ALT 716*  ALKPHOS 148*  BILITOT 1.4*  PROT 6.4*  ALBUMIN 2.9*   No results for input(s): LIPASE, AMYLASE in the last 168 hours. No results for input(s): AMMONIA in the last 168 hours. CBC:  Recent Labs Lab 06/10/16 0911 06/10/16 2209 06/11/16 0552 06/12/16 0227  WBC 23.0* 24.5* 21.7*  14.0*  NEUTROABS 19.6*  --   --  9.2*  HGB 15.1* 13.1 13.1 13.2  HCT 44.8 39.3 40.1 38.9  MCV 85.3 85.6 86.1 84.7  PLT 235 225 239 239   Cardiac Enzymes: No results for input(s): CKTOTAL, CKMB, CKMBINDEX, TROPONINI in the last 168 hours. BNP (last 3 results) No results for input(s): PROBNP in the last 8760 hours. CBG: No results for input(s): GLUCAP in the last 168 hours.  Recent Results (from the past 240 hour(s))  Urine culture     Status: Abnormal (Preliminary result)   Collection Time: 06/10/16 10:35 AM  Result Value Ref Range Status   Specimen Description URINE, CLEAN CATCH  Final   Special Requests NONE  Final   Culture >=100,000 COLONIES/mL ESCHERICHIA COLI (A)  Final   Report Status PENDING  Incomplete  Aerobic/Anaerobic Culture (surgical/deep wound)     Status: None (Preliminary result)   Collection Time: 06/10/16  7:56 PM  Result Value Ref Range Status   Specimen Description ABSCESS RIGHT ARM  Final   Special Requests PATIENT ON FOLLOWING VANCOMYCIN  Final   Gram Stain   Final    FEW WBC PRESENT, PREDOMINANTLY PMN FEW GRAM POSITIVE COCCI IN PAIRS    Culture CULTURE REINCUBATED FOR BETTER GROWTH  Final   Report Status PENDING  Incomplete     Studies: Dg Elbow Complete Right  06/10/2016  CLINICAL DATA:  Cellulitis with limited ability to flex and extend. No history of trauma EXAM: RIGHT ELBOW - COMPLETE 3+ VIEW COMPARISON:  None. FINDINGS: Frontal, lateral, and bilateral oblique views were obtained. There is persistent flexion throughout the study; patient is apparently unable to extend elbow. There is no demonstrable fracture or dislocation. No joint effusion. There is no appreciable joint space narrowing or erosion. IMPRESSION: Persistent flexion at the elbow joint. No fracture or dislocation. No arthropathic change noted. Electronically Signed   By: Bretta BangWilliam  Woodruff III M.D.   On: 06/10/2016 11:48     Scheduled Meds: . ampicillin-sulbactam (UNASYN) IV  3 g  Intravenous Q8H  . enoxaparin (LOVENOX) injection  40 mg Subcutaneous Q24H  . nicotine  21 mg Transdermal Daily  . pneumococcal 23 valent vaccine  0.5 mL Intramuscular Tomorrow-1000  . senna-docusate  2 tablet Oral BID  . vancomycin  750 mg Intravenous Q12H  . vitamin C  1,000 mg Oral Daily   Continuous Infusions: . sodium chloride 75 mL/hr (06/11/16 1317)  . lactated ringers 100 mL/hr (06/10/16 2257)   Principal Problem:   Cellulitis of elbow Active Problems:  UTI (lower urinary tract infection)   Heroin use   IV drug abuse   Hepatitis C antibody test positive  Time spent:   Standley Dakins, MD, FAAFP [facility] Pager [phone]  If 7PM-7AM, please contact night-coverage [email or web address] Password [email or web address] 06/12/2016, 10:20 AM    LOS: 2 days

## 2016-06-13 DIAGNOSIS — K5900 Constipation, unspecified: Secondary | ICD-10-CM

## 2016-06-13 LAB — URINE CULTURE: Culture: >=100000 — AB

## 2016-06-13 LAB — CBC
HEMATOCRIT: 39.5 % (ref 36.0–46.0)
Hemoglobin: 13.3 g/dL (ref 12.0–15.0)
MCH: 28.4 pg (ref 26.0–34.0)
MCHC: 33.7 g/dL (ref 30.0–36.0)
MCV: 84.4 fL (ref 78.0–100.0)
PLATELETS: 271 10*3/uL (ref 150–400)
RBC: 4.68 MIL/uL (ref 3.87–5.11)
RDW: 14.4 % (ref 11.5–15.5)
WBC: 7.8 10*3/uL (ref 4.0–10.5)

## 2016-06-13 LAB — BASIC METABOLIC PANEL
Anion gap: 11 (ref 5–15)
CALCIUM: 8.9 mg/dL (ref 8.9–10.3)
CO2: 24 mmol/L (ref 22–32)
CREATININE: 0.56 mg/dL (ref 0.44–1.00)
Chloride: 104 mmol/L (ref 101–111)
GFR calc Af Amer: >60 mL/min (ref >60)
GLUCOSE: 96 mg/dL (ref 65–99)
Potassium: 3.8 mmol/L (ref 3.5–5.1)
Sodium: 139 mmol/L (ref 135–145)

## 2016-06-13 MED ORDER — OXYCODONE-ACETAMINOPHEN 5-325 MG PO TABS: 1.0000 | ORAL_TABLET | Freq: Four times a day (QID) | ORAL | Status: DC | PRN

## 2016-06-13 MED ORDER — POLYETHYLENE GLYCOL 3350 17 G PO PACK: 17.0000 g | PACK | Freq: Two times a day (BID) | ORAL | Status: DC

## 2016-06-13 MED ORDER — SENNOSIDES-DOCUSATE SODIUM 8.6-50 MG PO TABS: 2.0000 | ORAL_TABLET | Freq: Two times a day (BID) | ORAL | Status: DC

## 2016-06-13 MED ORDER — DOXYCYCLINE HYCLATE 100 MG PO TABS: 100.0000 mg | ORAL_TABLET | Freq: Two times a day (BID) | ORAL | Status: DC

## 2016-06-13 MED ORDER — AMOXICILLIN-POT CLAVULANATE 875-125 MG PO TABS: 1.0000 | ORAL_TABLET | Freq: Two times a day (BID) | ORAL | Status: DC

## 2016-06-13 MED ORDER — ACIDOPHILUS PROBIOTIC 100 MG PO CAPS: 1.0000 | ORAL_CAPSULE | Freq: Three times a day (TID) | ORAL | Status: DC

## 2016-06-13 MED ORDER — ASCORBIC ACID 1000 MG PO TABS: 1000.0000 mg | ORAL_TABLET | Freq: Every day | ORAL | Status: DC

## 2016-06-13 NOTE — Progress Notes (Signed)
Bonnie PicklesVeronica L Yang to be D/C'd Home per MD order.  Discussed with the patient and all questions fully answered.  VSS, Skin clean, dry and intact without evidence of skin break down, no evidence of skin tears noted. IV catheter discontinued intact. Site without signs and symptoms of complications. Dressing and pressure applied.  An After Visit Summary was printed and given to the patient. Patient received prescription.  D/c education completed with patient/family including follow up instructions, medication list, d/c activities limitations if indicated, with other d/c instructions as indicated by MD - patient able to verbalize understanding, all questions fully answered.   Patient instructed to return to ED, call 911, or call MD for any changes in condition.   Patient escorted via WC, and D/C home via private auto.  [name] 06/13/2016 2:11 PM

## 2016-06-13 NOTE — Progress Notes (Signed)
Physical Therapy Wound Treatment Patient Details  Name: Bonnie Yang MRN: [id number] Date of Birth: 13-Nov-1986  Today's Date: 06/13/2016 Time: 0093-8182 Time Calculation (min): 41 min  Subjective  Subjective: Pt pleasant and agreeable to therapy Patient and Family Stated Goals: Heal wound Date of Onset:  (Unknown) Prior Treatments:  (I&D 7/11)  Pain Score: Pt was premedicated however still endorsed significant pain during session.  Wound Assessment  Wound / Incision (Open or Dehisced) 06/13/16 Incision - Open Arm Right;Proximal (Active)  Dressing Type ABD;Compression wrap;Gauze (Comment);Moist to dry 06/13/2016 12:56 PM  Dressing Changed Changed 06/13/2016 12:56 PM  Dressing Status Clean;Dry;Intact 06/13/2016 12:56 PM  Dressing Change Frequency Daily 06/13/2016 12:56 PM  Site / Wound Assessment Red 06/13/2016 12:56 PM  % Wound base Red or Granulating 100% 06/13/2016 12:56 PM  % Wound base Yellow 0% 06/13/2016 12:56 PM  % Wound base Black 0% 06/13/2016 12:56 PM  % Wound base Other (Comment) 0% 06/13/2016 12:56 PM  Peri-wound Assessment Intact 06/13/2016 12:56 PM  Wound Length (cm) 6 cm 06/13/2016 12:56 PM  Wound Width (cm) 2.7 cm 06/13/2016 12:56 PM  Wound Depth (cm) 3.7 cm 06/13/2016 12:56 PM  Undermining (cm) 2.0 at 7:00 06/13/2016 12:56 PM  Margins Unattached edges (unapproximated) 06/13/2016 12:56 PM  Closure None 06/13/2016 12:56 PM  Drainage Amount Moderate 06/13/2016 12:56 PM  Drainage Description Sanguineous 06/13/2016 12:56 PM  Treatment Hydrotherapy (Pulse lavage);Packing (Impregnated strip) 06/13/2016 12:56 PM     Wound / Incision (Open or Dehisced) 06/13/16 Incision - Open Arm Right;Distal (Active)  Dressing Type ABD;Compression wrap;Gauze (Comment);Moist to dry 06/13/2016 12:56 PM  Dressing Changed Changed 06/13/2016 12:56 PM  Dressing Status Clean;Dry;Intact 06/13/2016 12:56 PM  Dressing Change Frequency Daily 06/13/2016 12:56 PM  Site / Wound Assessment Red 06/13/2016 12:56 PM  %  Wound base Red or Granulating 100% 06/13/2016 12:56 PM  % Wound base Yellow 0% 06/13/2016 12:56 PM  % Wound base Black 0% 06/13/2016 12:56 PM  % Wound base Other (Comment) 0% 06/13/2016 12:56 PM  Peri-wound Assessment Intact 06/13/2016 12:56 PM  Wound Length (cm) 4.5 cm 06/13/2016 12:56 PM  Wound Width (cm) 2.6 cm 06/13/2016 12:56 PM  Wound Depth (cm) 1.5 cm 06/13/2016 12:56 PM  Undermining (cm) 1.2 at 9:00; 1.0 at 2:00 06/13/2016 12:56 PM  Margins Unattached edges (unapproximated) 06/13/2016 12:56 PM  Closure None 06/13/2016 12:56 PM  Drainage Amount Moderate 06/13/2016 12:56 PM  Drainage Description Sanguineous 06/13/2016 12:56 PM  Treatment Hydrotherapy (Pulse lavage);Packing (Impregnated strip) 06/13/2016 12:56 PM   Hydrotherapy Pulsed lavage therapy - wound location: RUE both wound locations Pulsed Lavage with Suction (psi): 4 psi (for pain control) Pulsed Lavage with Suction - Normal Saline Used: 1000 mL Pulsed Lavage Tip: Tip with splash shield   Wound Assessment and Plan  Wound Therapy - Assess/Plan/Recommendations Wound Therapy - Clinical Statement: Pt presents s/p I&D of RUE due to abscess. Now 2 open wound locations. Pt will benefit from continued hydrotherapy to promote wound bed healing and decrease bioburden.  Wound Therapy - Functional Problem List: Decreased gross and fine motor skills in RUE due to pain and stiffness.  Factors Delaying/Impairing Wound Healing: Substance abuse Hydrotherapy Plan: Dressing change;Patient/family education;Debridement;Pulsatile lavage with suction Wound Therapy - Frequency: 6X / week Wound Therapy - Follow Up Recommendations: Home health RN Wound Plan: See above  Wound Therapy Goals- Improve the function of patient's integumentary system by progressing the wound(s) through the phases of wound healing (inflammation - proliferation - remodeling) by: Patient/Family will be able  to : complete self-dressing changes independently. Patient/Family Instruction  Goal - Progress: Goal set today Goals/treatment plan/discharge plan were made with and agreed upon by patient/family: Yes Time For Goal Achievement: 7 days Wound Therapy - Potential for Goals: Excellent  Goals will be updated until maximal potential achieved or discharge criteria met.  Discharge criteria: when goals achieved, discharge from hospital, MD decision/surgical intervention, no progress towards goals, refusal/missing three consecutive treatments without notification or medical reason.  GP     Rolinda Roan 06/13/2016, 1:28 PM   Rolinda Roan, PT, DPT Acute Rehabilitation Services Pager: [phone]

## 2016-06-13 NOTE — Progress Notes (Signed)
Orthopedic Tech Progress Note Patient Details:  Bonnie PicklesVeronica L Yang 10/10/1986 [id number]  Ortho Devices Type of Ortho Device: Arm sling Ortho Device/Splint Location: RUE Ortho Device/Splint Interventions: Ordered, Application   Jennye MoccasinHughes, Bonnie Yang 06/13/2016, 3:33 PM

## 2016-06-13 NOTE — Discharge Summary (Signed)
Physician Discharge Summary  Bonnie PicklesVeronica L Yang WUJ:[id number] DOB: 03/29/1986 DOA: 06/10/2016  PCP: No PCP Per Patient  Admit date: 06/10/2016 Discharge date: 06/13/2016  Recommendations for Outpatient Follow-up:  1. Follow up with surgery later this week for resuming hydrotherapy as they recommend for you.  2. Please follow up with GI specialist to discuss treatment for Hep C.  3. Please establish primary care with one of the clinics listed for you to call.     Discharge Condition: Stable CODE STATUS: FULL Diet recommendation: Regular   Brief/Interim Summary: Hospital course: 30 yo rhd female present with family members states she has been having increasing pain, swelling, erythema of the right elbow/arm for 3 days. This is a throbbing pain that she rates at 10/10. It is worsened with movement and alleviated with immobilization. She has had chills and vomiting. She diagnosed with Right arm abscess. Pt was taken to OR 7/9 for I&D in 2 places.   Assessment & Plan:  1. Right arm abscess s/p I&D 7/9 - post-op care per ortho team. GROUP A STREP (S.PYOGENES) ISOLATED.   Placed on oral antibiotics augmentin/doxy - Hydrotherapy started in hospital.  I spoke with surgery - she will complete hydrotherapy in the office.   2. Symptomatic UTI - UC positive for E. Coli. continue antibiotics.  3. Polysubstance abuse and IVDA -  TTE negative for vegetations. HIV NR, Hep C Ab positive. 4. Hep C Ab positive with elevated liver enzymes - Pt will need an outpatient GI appointment to address treatment options.  I explained to her the importance of follow up.    5. Tobacco use- nicotine patch and cessation encouraged.  6. Chronic constipation - Pt prescribed twice daily laxatives and stool softeners to take regularly.    DVT prophylaxis: SCDs Code Status: full  Disposition Plan: Home after hydrotherapy Consults called: hand surgery  Discharge Diagnoses:  Principal Problem:   Cellulitis of  elbow Active Problems:   UTI (lower urinary tract infection)   Heroin use   IV drug abuse   Hepatitis C antibody test positive  Discharge Instructions  Discharge Instructions    Increase activity slowly    Complete by:  As directed             Medication List    TAKE these medications        ACIDOPHILUS PROBIOTIC 100 MG Caps  Take 1 capsule (100 mg total) by mouth 3 (three) times daily with meals.     amoxicillin-clavulanate 875-125 MG tablet  Commonly known as:  AUGMENTIN  Take 1 tablet by mouth every 12 (twelve) hours.     ascorbic acid 1000 MG tablet  Commonly known as:  VITAMIN C  Take 1 tablet (1,000 mg total) by mouth daily.     doxycycline 100 MG tablet  Commonly known as:  VIBRA-TABS  Take 1 tablet (100 mg total) by mouth every 12 (twelve) hours.     oxyCODONE-acetaminophen 5-325 MG tablet  Commonly known as:  PERCOCET/ROXICET  Take 1 tablet by mouth every 6 (six) hours as needed for moderate pain or severe pain.     polyethylene glycol packet  Commonly known as:  MIRALAX / GLYCOLAX  Take 17 g by mouth 2 (two) times daily.     senna-docusate 8.6-50 MG tablet  Commonly known as:  Senokot-S  Take 2 tablets by mouth 2 (two) times daily.           Follow-up Information    Follow up with [facility]. Schedule an appointment as soon as possible for a visit in 2 weeks.   Specialty:  Gastroenterology   Why:  Hospital Follow Up   Contact information:   [address] [place] [address] [phone]      Follow up with [facility]. Schedule an appointment as soon as possible for a visit in 1 week.   Why:  Hospital Follow Up   Contact information:   [address] [place] [address] [phone]      Follow up with [facility]. Schedule an appointment as soon as possible for a visit in 1 week.   Why:  Hospital Follow Up  establish care if wellness  center doesn't have appointments   Contact information:   [address] [place] [address]       Follow up with Tami Ribas, MD. Schedule an appointment as soon as possible for a visit in 3 days.   Specialty:  Orthopedic Surgery   Why:  Hospital Follow Up AND HYDROTHERAPY TREATMENTS   Contact information:   [address] [place] [address] [phone]      Allergies  Allergen Reactions  . Morphine And Related Hives   Consultations:  Hand surgery  Procedures/Studies: Dg Elbow Complete Right  06/10/2016  CLINICAL DATA:  Cellulitis with limited ability to flex and extend. No history of trauma EXAM: RIGHT ELBOW - COMPLETE 3+ VIEW COMPARISON:  None. FINDINGS: Frontal, lateral, and bilateral oblique views were obtained. There is persistent flexion throughout the study; patient is apparently unable to extend elbow. There is no demonstrable fracture or dislocation. No joint effusion. There is no appreciable joint space narrowing or erosion. IMPRESSION: Persistent flexion at the elbow joint. No fracture or dislocation. No arthropathic change noted. Electronically Signed   By: Bretta Bang III M.D.   On: 06/10/2016 11:48      Subjective: Pt reporting she is doing much better.    Discharge Exam: Filed Vitals:   06/12/16 1952 06/13/16 0500  BP: 114/78 113/96  Pulse: 91 81  Temp: 98.7 F (37.1 C) 97.8 F (36.6 C)  Resp: 16 16   Filed Vitals:   06/12/16 0504 06/12/16 1400 06/12/16 1952 06/13/16 0500  BP: 103/59 125/81 114/78 113/96  Pulse: 81 89 91 81  Temp: 98 F (36.7 C) 99.1 F (37.3 C) 98.7 F (37.1 C) 97.8 F (36.6 C)  TempSrc: Oral  Oral Oral  Resp: 16 16 16 16   Height:      Weight:      SpO2: 98% 100% 100% 100%   General exam: no apparent distress Respiratory system: Clear. No increased work of breathing. Cardiovascular system: S1 & S2 heard.  Gastrointestinal system: Abdomen is nondistended, soft and nontender. Normal bowel sounds  heard. Central nervous system: No focal neurological deficits. Extremities: wound clean and dry bandaged.   The results of significant diagnostics from this hospitalization (including imaging, microbiology, ancillary and laboratory) are listed below for reference.     Microbiology: Recent Results (from the past 240 hour(s))  Urine culture     Status: Abnormal   Collection Time: 06/10/16 10:35 AM  Result Value Ref Range Status   Specimen Description URINE, CLEAN CATCH  Final   Special Requests NONE  Final   Culture >=100,000 COLONIES/mL ESCHERICHIA COLI (A)  Final   Report Status 06/13/2016 FINAL  Final   Organism ID, Bacteria ESCHERICHIA COLI (A)  Final  Susceptibility   Escherichia coli - MIC*    AMPICILLIN <=2 SENSITIVE Sensitive     CEFAZOLIN <=4 SENSITIVE Sensitive     CEFTRIAXONE <=1 SENSITIVE Sensitive     CIPROFLOXACIN <=0.25 SENSITIVE Sensitive     GENTAMICIN <=1 SENSITIVE Sensitive     IMIPENEM <=0.25 SENSITIVE Sensitive     NITROFURANTOIN <=16 SENSITIVE Sensitive     TRIMETH/SULFA <=20 SENSITIVE Sensitive     AMPICILLIN/SULBACTAM <=2 SENSITIVE Sensitive     PIP/TAZO <=4 SENSITIVE Sensitive     * >=100,000 COLONIES/mL ESCHERICHIA COLI  Aerobic/Anaerobic Culture (surgical/deep wound)     Status: None (Preliminary result)   Collection Time: 06/10/16  7:56 PM  Result Value Ref Range Status   Specimen Description ABSCESS RIGHT ARM  Final   Special Requests PATIENT ON FOLLOWING VANCOMYCIN  Final   Gram Stain   Final    FEW WBC PRESENT, PREDOMINANTLY PMN FEW GRAM POSITIVE COCCI IN PAIRS    Culture   Final    MODERATE GROUP A STREP (S.PYOGENES) ISOLATED NO ANAEROBES ISOLATED; CULTURE IN PROGRESS FOR 5 DAYS    Report Status PENDING  Incomplete    Labs: BNP (last 3 results) No results for input(s): BNP in the last 8760 hours. Basic Metabolic Panel:  Recent Labs Lab 06/10/16 0911 06/10/16 2209 06/11/16 1054 06/12/16 0227 06/13/16 0312  NA 135  --  133* 137  139  K 3.8  --  3.9 3.5 3.8  CL 101  --  103 106 104  CO2 24  --  22 23 24   GLUCOSE 103*  --  131* 112* 96  BUN 9  --  <5* <5* <5*  CREATININE 0.71 0.74 0.72 0.75 0.56  CALCIUM 8.7*  --  8.5* 8.7* 8.9   Liver Function Tests:  Recent Labs Lab 06/12/16 0227  AST 211*  ALT 716*  ALKPHOS 148*  BILITOT 1.4*  PROT 6.4*  ALBUMIN 2.9*   No results for input(s): LIPASE, AMYLASE in the last 168 hours. No results for input(s): AMMONIA in the last 168 hours. CBC:  Recent Labs Lab 06/10/16 0911 06/10/16 2209 06/11/16 0552 06/12/16 0227 06/13/16 0312  WBC 23.0* 24.5* 21.7* 14.0* 7.8  NEUTROABS 19.6*  --   --  9.2*  --   HGB 15.1* 13.1 13.1 13.2 13.3  HCT 44.8 39.3 40.1 38.9 39.5  MCV 85.3 85.6 86.1 84.7 84.4  PLT 235 225 239 239 271   Cardiac Enzymes: No results for input(s): CKTOTAL, CKMB, CKMBINDEX, TROPONINI in the last 168 hours. BNP: Invalid input(s): POCBNP CBG: No results for input(s): GLUCAP in the last 168 hours. D-Dimer No results for input(s): DDIMER in the last 72 hours. Hgb A1c No results for input(s): HGBA1C in the last 72 hours. Lipid Profile No results for input(s): CHOL, HDL, LDLCALC, TRIG, CHOLHDL, LDLDIRECT in the last 72 hours. Thyroid function studies No results for input(s): TSH, T4TOTAL, T3FREE, THYROIDAB in the last 72 hours.  Invalid input(s): FREET3 Anemia work up No results for input(s): VITAMINB12, FOLATE, FERRITIN, TIBC, IRON, RETICCTPCT in the last 72 hours. Urinalysis    Component Value Date/Time   COLORURINE AMBER* 06/10/2016 1035   APPEARANCEUR HAZY* 06/10/2016 1035   LABSPEC >1.030* 06/10/2016 1035   PHURINE 5.5 06/10/2016 1035   GLUCOSEU NEGATIVE 06/10/2016 1035   HGBUR SMALL* 06/10/2016 1035   BILIRUBINUR SMALL* 06/10/2016 1035   KETONESUR >80* 06/10/2016 1035   PROTEINUR TRACE* 06/10/2016 1035   UROBILINOGEN 0.2 09/04/2013 2138   NITRITE POSITIVE* 06/10/2016 1035  LEUKOCYTESUR NEGATIVE 06/10/2016 1035   Sepsis  Labs Invalid input(s): PROCALCITONIN,  WBC,  LACTICIDVEN Microbiology Recent Results (from the past 240 hour(s))  Urine culture     Status: Abnormal   Collection Time: 06/10/16 10:35 AM  Result Value Ref Range Status   Specimen Description URINE, CLEAN CATCH  Final   Special Requests NONE  Final   Culture >=100,000 COLONIES/mL ESCHERICHIA COLI (A)  Final   Report Status 06/13/2016 FINAL  Final   Organism ID, Bacteria ESCHERICHIA COLI (A)  Final      Susceptibility   Escherichia coli - MIC*    AMPICILLIN <=2 SENSITIVE Sensitive     CEFAZOLIN <=4 SENSITIVE Sensitive     CEFTRIAXONE <=1 SENSITIVE Sensitive     CIPROFLOXACIN <=0.25 SENSITIVE Sensitive     GENTAMICIN <=1 SENSITIVE Sensitive     IMIPENEM <=0.25 SENSITIVE Sensitive     NITROFURANTOIN <=16 SENSITIVE Sensitive     TRIMETH/SULFA <=20 SENSITIVE Sensitive     AMPICILLIN/SULBACTAM <=2 SENSITIVE Sensitive     PIP/TAZO <=4 SENSITIVE Sensitive     * >=100,000 COLONIES/mL ESCHERICHIA COLI  Aerobic/Anaerobic Culture (surgical/deep wound)     Status: None (Preliminary result)   Collection Time: 06/10/16  7:56 PM  Result Value Ref Range Status   Specimen Description ABSCESS RIGHT ARM  Final   Special Requests PATIENT ON FOLLOWING VANCOMYCIN  Final   Gram Stain   Final    FEW WBC PRESENT, PREDOMINANTLY PMN FEW GRAM POSITIVE COCCI IN PAIRS    Culture   Final    MODERATE GROUP A STREP (S.PYOGENES) ISOLATED NO ANAEROBES ISOLATED; CULTURE IN PROGRESS FOR 5 DAYS    Report Status PENDING  Incomplete    Time coordinating discharge: 29 minutes  SIGNED:  Standley Dakins, MD  [facility] 06/13/2016, 8:20 AM Pager   If 7PM-7AM, please contact night-coverage [email or web address] Password [email or web address]

## 2016-06-15 LAB — AEROBIC/ANAEROBIC CULTURE (SURGICAL/DEEP WOUND)

## 2016-06-15 LAB — AEROBIC/ANAEROBIC CULTURE W GRAM STAIN (SURGICAL/DEEP WOUND)

## 2016-10-12 ENCOUNTER — Emergency Department (HOSPITAL_COMMUNITY)
Admission: EM | Admit: 2016-10-12 | Discharge: 2016-10-12 | Disposition: A | Discharge status: Home / Self Care | Payer: Self-pay | Attending: Emergency Medicine | Admitting: Emergency Medicine

## 2016-10-12 ENCOUNTER — Encounter (HOSPITAL_COMMUNITY): Payer: Self-pay | Admitting: Emergency Medicine

## 2016-10-12 DIAGNOSIS — K047 Periapical abscess without sinus: Secondary | ICD-10-CM | POA: Insufficient documentation

## 2016-10-12 DIAGNOSIS — F17210 Nicotine dependence, cigarettes, uncomplicated: Secondary | ICD-10-CM | POA: Insufficient documentation

## 2016-10-12 MED ORDER — IBUPROFEN 400 MG PO TABS
400.0000 mg | ORAL_TABLET | Freq: Once | ORAL | Status: AC
  Administered 2016-10-12: 400 mg via ORAL
  Filled 2016-10-12: qty 1

## 2016-10-12 MED ORDER — ONDANSETRON HCL 4 MG PO TABS
4.0000 mg | ORAL_TABLET | Freq: Once | ORAL | Status: AC
  Administered 2016-10-12: 4 mg via ORAL
  Filled 2016-10-12: qty 1

## 2016-10-12 MED ORDER — ACETAMINOPHEN 500 MG PO TABS
1000.0000 mg | ORAL_TABLET | Freq: Once | ORAL | Status: AC
  Administered 2016-10-12: 1000 mg via ORAL
  Filled 2016-10-12: qty 2

## 2016-10-12 MED ORDER — AMOXICILLIN-POT CLAVULANATE 875-125 MG PO TABS
1.0000 | ORAL_TABLET | Freq: Once | ORAL | Status: AC
  Administered 2016-10-12: 1 via ORAL
  Filled 2016-10-12: qty 1

## 2016-10-12 MED ORDER — AMOXICILLIN 500 MG PO CAPS: 500.0000 mg | ORAL_CAPSULE | Freq: Three times a day (TID) | ORAL | 0 refills | Status: DC

## 2016-10-12 NOTE — ED Provider Notes (Signed)
[facility]-EMER[facility]Y DEPT Provider Note   CSN: [id number] Arrival date & time: 10/12/16  0744     History   Chief Complaint Chief Complaint  Patient presents with  . Dental Pain    HPI Bonnie Yang is a 30 y.o. female.  The history is provided by the patient.  Dental Pain   This is a new problem. The current episode started more than 2 days ago. The problem occurs daily. The problem has not changed since onset.The pain is moderate. She has tried acetaminophen for the symptoms. The treatment provided no relief.    Past Medical History:  Diagnosis Date  . Anemia   . GERD (gastroesophageal reflux disease)   . Hepatitis C antibody test positive   . IV drug abuse   . Tobacco abuse   . UTI (urinary tract infection) 06/2016    Patient Active Problem List   Diagnosis Date Noted  . IV drug abuse   . Hepatitis C antibody test positive   . Cellulitis of elbow 06/10/2016  . UTI (lower urinary tract infection) 06/10/2016  . Heroin use 06/10/2016  . Facial cellulitis 04/02/2015    Past Surgical History:  Procedure Laterality Date  . ABCESS DRAINAGE    . ABCESS DRAINAGE  04/2016   nose   . I&D EXTREMITY Right 06/10/2016   Procedure: IRRIGATION AND DEBRIDEMENT ARM;  Surgeon: Betha LoaKevin Kuzma, MD;  [facility];  Service: Orthopedics;  Laterality: Right;    OB History    No data available       Home Medications    Prior to Admission medications   Medication Sig Start Date End Date Taking? Authorizing Provider  amoxicillin-clavulanate (AUGMENTIN) 875-125 MG tablet Take 1 tablet by mouth every 12 (twelve) hours. 06/13/16   Clanford Cyndie MullL Johnson, MD  doxycycline (VIBRA-TABS) 100 MG tablet Take 1 tablet (100 mg total) by mouth every 12 (twelve) hours. 06/13/16   Clanford Cyndie MullL Johnson, MD  Lactobacillus (ACIDOPHILUS PROBIOTIC) 100 MG CAPS Take 1 capsule (100 mg total) by mouth 3 (three) times daily with meals. 06/13/16   Clanford Cyndie MullL Johnson, MD  oxyCODONE-acetaminophen (PERCOCET/ROXICET) 5-325  MG tablet Take 1 tablet by mouth every 6 (six) hours as needed for moderate pain or severe pain. 06/13/16   Clanford Cyndie MullL Johnson, MD  polyethylene glycol (MIRALAX / GLYCOLAX) packet Take 17 g by mouth 2 (two) times daily. 06/13/16   Clanford Cyndie MullL Johnson, MD  senna-docusate (SENOKOT-S) 8.6-50 MG tablet Take 2 tablets by mouth 2 (two) times daily. 06/13/16   Clanford Cyndie MullL Johnson, MD  vitamin C (VITAMIN C) 1000 MG tablet Take 1 tablet (1,000 mg total) by mouth daily. 06/13/16   Clanford Cyndie MullL Johnson, MD    Family History Family History  Problem Relation Age of Onset  . Hypertension Mother   . Heart failure Mother     Social History Social History  Substance Use Topics  . Smoking status: Current Every Day Smoker    Packs/day: 0.50    Years: 10.00    Types: Cigarettes  . Smokeless tobacco: Never Used  . Alcohol use Yes     Comment: 1I beer ocasionally     Allergies   Morphine and related   Review of Systems Review of Systems  Constitutional: Negative for activity change.       All ROS Neg except as noted in HPI  HENT: Positive for dental problem. Negative for nosebleeds and trouble swallowing.   Eyes: Negative for photophobia and discharge.  Respiratory: Negative for  cough, shortness of breath and wheezing.   Cardiovascular: Negative for chest pain and palpitations.  Gastrointestinal: Negative for abdominal pain and blood in stool.  Genitourinary: Negative for dysuria, frequency and hematuria.  Musculoskeletal: Negative for arthralgias, back pain and neck pain.  Skin: Negative.   Neurological: Negative for dizziness, seizures and speech difficulty.  Psychiatric/Behavioral: Negative for confusion and hallucinations.     Physical Exam Updated Vital Signs BP 108/68 (BP Location: Right Arm)   Pulse 88   Temp 98.2 F (36.8 C) (Oral)   Resp 14   Ht 5\' 5"  (1.651 m)   Wt 49.9 kg   LMP 09/20/2016   SpO2 99%   BMI 18.30 kg/m   Physical Exam  Constitutional: She is oriented to  person, place, and time. She appears well-developed and well-nourished.  Non-toxic appearance.  HENT:  Head: Normocephalic.  Right Ear: Tympanic membrane and external ear normal.  Left Ear: Tympanic membrane and external ear normal.  Right and left wisdom teeth erupting through the gum.  Airway is patent. Tongue not swollen. Swelling of submental nodes on the right.  Eyes: EOM and lids are normal. Pupils are equal, round, and reactive to light.  Neck: Normal range of motion. Neck supple. Carotid bruit is not present.  Cardiovascular: Normal rate, regular rhythm, normal heart sounds, intact distal pulses and normal pulses.   Pulmonary/Chest: Breath sounds normal. No respiratory distress.  Abdominal: Soft. Bowel sounds are normal. There is no tenderness. There is no guarding.  Musculoskeletal: Normal range of motion.  Lymphadenopathy:       Head (right side): No submandibular adenopathy present.       Head (left side): No submandibular adenopathy present.    She has no cervical adenopathy.  Neurological: She is alert and oriented to person, place, and time. She has normal strength. No cranial nerve deficit or sensory deficit.  Skin: Skin is warm and dry.  Psychiatric: She has a normal mood and affect. Her speech is normal.  Nursing note and vitals reviewed.    ED Treatments / Results  Labs (all labs ordered are listed, but only abnormal results are displayed) Labs Reviewed - No data to display  EKG  EKG Interpretation None       Radiology No results found.  Procedures Procedures (including critical care time)  Medications Ordered in ED Medications - No data to display   Initial Impression / Assessment and Plan / ED Course  I have reviewed the triage vital signs and the nursing notes.  Pertinent labs & imaging results that were available during my care of the patient were reviewed by me and considered in my medical decision making (see chart for details).  Clinical  Course     *I have reviewed nursing notes, vital signs, and all appropriate lab and imaging results for this patient.**  Final Clinical Impressions(s) / ED Diagnoses  Pt has an infected wisdom tooth with swollen lymph nodes. No evidence of Ludwig's angina. Vital signs stable. Pt treated with amoxil. She will continue ibuprofen and tylenol for pain. Pt strongly encouraged to see a dentist as soon as possible.   Final diagnoses:  Dental infection    New Prescriptions New Prescriptions   No medications on file     Ivery QualeHobson Neno Hohensee, PA-C 10/12/16 0850    Bethann BerkshireJoseph Zammit, MD 10/14/16 [phone]

## 2016-10-12 NOTE — ED Triage Notes (Signed)
Patient complaining of pain to lower right side of mouth. States "I had my tongue pierced last week but took it out Monday and my tongue has been swollen. I also have my wisdom teeth trying to come in." Patient unsure if pain is related to piercing or teeth. States pain started 2 days ago.

## 2016-10-12 NOTE — Discharge Instructions (Signed)
You have an infected tooth on the right. It is important that you see a dentist or oral surgeon as soon as possible. Use ibuprofen and 500mg  of  tylenol every 6 hours for pain. Use amoxil three times daily until all taken. Take this medication with food.

## 2017-01-28 ENCOUNTER — Encounter (HOSPITAL_COMMUNITY): Payer: Self-pay

## 2017-01-28 ENCOUNTER — Emergency Department (HOSPITAL_COMMUNITY)
Admission: EM | Admit: 2017-01-28 | Discharge: 2017-01-28 | Disposition: A | Discharge status: Home / Self Care | Payer: Self-pay | Attending: Emergency Medicine | Admitting: Emergency Medicine

## 2017-01-28 DIAGNOSIS — S0181XA Laceration without foreign body of other part of head, initial encounter: Secondary | ICD-10-CM | POA: Insufficient documentation

## 2017-01-28 DIAGNOSIS — Y929 Unspecified place or not applicable: Secondary | ICD-10-CM | POA: Insufficient documentation

## 2017-01-28 DIAGNOSIS — F17210 Nicotine dependence, cigarettes, uncomplicated: Secondary | ICD-10-CM | POA: Insufficient documentation

## 2017-01-28 DIAGNOSIS — Z79899 Other long term (current) drug therapy: Secondary | ICD-10-CM | POA: Insufficient documentation

## 2017-01-28 DIAGNOSIS — Z23 Encounter for immunization: Secondary | ICD-10-CM | POA: Insufficient documentation

## 2017-01-28 DIAGNOSIS — Y939 Activity, unspecified: Secondary | ICD-10-CM | POA: Insufficient documentation

## 2017-01-28 DIAGNOSIS — Y999 Unspecified external cause status: Secondary | ICD-10-CM | POA: Insufficient documentation

## 2017-01-28 DIAGNOSIS — W109XXA Fall (on) (from) unspecified stairs and steps, initial encounter: Secondary | ICD-10-CM | POA: Insufficient documentation

## 2017-01-28 MED ORDER — TETANUS-DIPHTH-ACELL PERTUSSIS 5-2.5-18.5 LF-MCG/0.5 IM SUSP
0.5000 mL | Freq: Once | INTRAMUSCULAR | Status: AC
  Administered 2017-01-28: 0.5 mL via INTRAMUSCULAR
  Filled 2017-01-28: qty 0.5

## 2017-01-28 MED ORDER — LIDOCAINE HCL (PF) 1 % IJ SOLN
5.0000 mL | Freq: Once | INTRAMUSCULAR | Status: AC
  Administered 2017-01-28: 5 mL via INTRADERMAL
  Filled 2017-01-28: qty 5

## 2017-01-28 NOTE — ED Provider Notes (Signed)
[facility]-EMER[facility] DEPT Provider Note   CSN: [id number] Arrival date & time: 01/28/17  0207     History   Chief Complaint Chief Complaint  Patient presents with  . Facial Laceration    HPI Bonnie Yang is a 31 y.o. female with a hx of IVDU, GERD, anemia presents to the Emergency Department complaining of acute laceration to the chin. Patient reports she had a large amount of alcohol last night tripped and fell striking her chin on the ground. She denies headache, numbness, tingling, neck pain, back pain. She has applied pressure with hemostasis.      The history is provided by the patient and medical records. No language interpreter was used.    Past Medical History:  Diagnosis Date  . Anemia   . GERD (gastroesophageal reflux disease)   . Hepatitis C antibody test positive   . IV drug abuse   . Tobacco abuse   . UTI (urinary tract infection) 06/2016    Patient Active Problem List   Diagnosis Date Noted  . IV drug abuse   . Hepatitis C antibody test positive   . Cellulitis of elbow 06/10/2016  . UTI (lower urinary tract infection) 06/10/2016  . Heroin use 06/10/2016  . Facial cellulitis 04/02/2015    Past Surgical History:  Procedure Laterality Date  . ABCESS DRAINAGE    . ABCESS DRAINAGE  04/2016   nose   . I&D EXTREMITY Right 06/10/2016   Procedure: IRRIGATION AND DEBRIDEMENT ARM;  Surgeon: Betha Loa, MD;  Location: [facility] OR;  Service: Orthopedics;  Laterality: Right;    OB History    No data available       Home Medications    Prior to Admission medications   Medication Sig Start Date End Date Taking? Authorizing Provider  amoxicillin (AMOXIL) 500 MG capsule Take 1 capsule (500 mg total) by mouth 3 (three) times daily. 10/12/16   Ivery Quale, PA-C  amoxicillin-clavulanate (AUGMENTIN) 875-125 MG tablet Take 1 tablet by mouth every 12 (twelve) hours. 06/13/16   Clanford Cyndie Mull, MD  doxycycline (VIBRA-TABS) 100 MG tablet Take 1 tablet (100 mg total) by mouth  every 12 (twelve) hours. 06/13/16   Clanford Cyndie Mull, MD  Lactobacillus (ACIDOPHILUS PROBIOTIC) 100 MG CAPS Take 1 capsule (100 mg total) by mouth 3 (three) times daily with meals. 06/13/16   Clanford Cyndie Mull, MD  oxyCODONE-acetaminophen (PERCOCET/ROXICET) 5-325 MG tablet Take 1 tablet by mouth every 6 (six) hours as needed for moderate pain or severe pain. 06/13/16   Clanford Cyndie Mull, MD  polyethylene glycol (MIRALAX / GLYCOLAX) packet Take 17 g by mouth 2 (two) times daily. 06/13/16   Clanford Cyndie Mull, MD  senna-docusate (SENOKOT-S) 8.6-50 MG tablet Take 2 tablets by mouth 2 (two) times daily. 06/13/16   Clanford Cyndie Mull, MD  vitamin C (VITAMIN C) 1000 MG tablet Take 1 tablet (1,000 mg total) by mouth daily. 06/13/16   Clanford Cyndie Mull, MD    Family History Family History  Problem Relation Age of Onset  . Hypertension Mother   . Heart failure Mother     Social History Social History  Substance Use Topics  . Smoking status: Current Every Day Smoker    Packs/day: 0.50    Years: 10.00    Types: Cigarettes  . Smokeless tobacco: Never Used  . Alcohol use Yes     Comment: 1I beer ocasionally     Allergies   Morphine and related   Review of Systems Review of  Systems  Skin: Positive for wound.  All other systems reviewed and are negative.    Physical Exam Updated Vital Signs BP 99/62   Pulse 84   Temp 97.7 F (36.5 C) (Oral)   Resp 18   Ht 5\' 6"  (1.676 m)   Wt 52.2 kg   LMP 01/19/2017 (Exact Date)   SpO2 98%   BMI 18.56 kg/m   Physical Exam  Constitutional: She is oriented to person, place, and time. She appears well-developed and well-nourished. No distress.  Patient smells of EtOH and appears intoxicated. She is alert and answers questions appropriately but giggles throughout exam  HENT:  Head: Normocephalic and atraumatic.  3cm laceration to the chin  Eyes: Conjunctivae are normal. No scleral icterus.  Neck: Normal range of motion. Neck supple.  Full  range of motion without pain No midline or paraspinal tenderness.  Cardiovascular: Normal rate, regular rhythm, normal heart sounds and intact distal pulses.   No murmur heard. Capillary refill < 3 sec  Pulmonary/Chest: Effort normal and breath sounds normal. No respiratory distress.  Musculoskeletal: Normal range of motion. She exhibits no edema.  Neurological: She is alert and oriented to person, place, and time. GCS eye subscore is 4. GCS verbal subscore is 5. GCS motor subscore is 6.  Strength 5/5 in the bilateral upper and lower extremities.  Sensation intact in all 4 extremities  Skin: Skin is warm and dry. She is not diaphoretic.  Psychiatric: She has a normal mood and affect.  Nursing note and vitals reviewed.    ED Treatments / Results   Procedures .Marland Kitchen.Laceration Repair Date/Time: 01/28/2017 7:17 AM Performed by: Dierdre ForthMUTHERSBAUGH, Cynthea Zachman Authorized by: Dierdre ForthMUTHERSBAUGH, Sanye Ledesma   Consent:    Consent obtained:  Verbal   Consent given by:  Patient   Risks discussed:  Infection, pain and poor cosmetic result   Alternatives discussed:  No treatment Anesthesia (see MAR for exact dosages):    Anesthesia method:  Local infiltration   Local anesthetic:  Lidocaine 1% w/o epi (5ml) Laceration details:    Location:  Face   Face location:  Chin   Length (cm):  3   Depth (mm):  1.5 Repair type:    Repair type:  Simple Pre-procedure details:    Preparation:  Patient was prepped and draped in usual sterile fashion Exploration:    Hemostasis achieved with:  Direct pressure   Wound exploration: entire depth of wound probed and visualized   Treatment:    Area cleansed with:  Saline   Amount of cleaning:  Standard   Irrigation solution:  Sterile water   Irrigation volume:  250   Irrigation method:  Syringe Skin repair:    Repair method:  Sutures   Suture size:  6-0   Suture material:  Prolene   Suture technique:  Simple interrupted   Number of sutures:  3 Approximation:     Approximation:  Close   Vermilion border: well-aligned   Post-procedure details:    Dressing:  Open (no dressing)   Patient tolerance of procedure:  Tolerated well, no immediate complications   (including critical care time)  Medications Ordered in ED Medications  Tdap (BOOSTRIX) injection 0.5 mL (not administered)  lidocaine (PF) (XYLOCAINE) 1 % injection 5 mL (5 mLs Intradermal Given by Other 01/28/17 [id number])     Initial Impression / Assessment and Plan / ED Course  I have reviewed the triage vital signs and the nursing notes.  Pertinent labs & imaging results that were available during my  care of the patient were reviewed by me and considered in my medical decision making (see chart for details).  Clinical Course as of Jan 28 721  Wynelle Link Jan 28, 2017  0719 Patient has sobered significantly. Laceration has been repaired. Patient ambulates without any gait. Repeat exam is without neuro deficit, neck pain or back pain.  [HM]    Clinical Course User Index [HM] Dahlia Client Maybelline Kolarik, PA-C    Pressure irrigation performed. Wound explored and base of wound visualized in a bloodless field without evidence of foreign body.  Laceration occurred < 8 hours prior to repair which was well tolerated. Tdap updated.  Pt has no comorbidities to effect normal wound healing. Pt discharged without antibiotics.  Discussed suture home care with patient and answered questions. Pt to follow-up for wound check and suture removal in 5 days; they are to return to the ED sooner for signs of infection. Pt is hemodynamically stable with no complaints prior to dc.    Final Clinical Impressions(s) / ED Diagnoses   Final diagnoses:  Facial laceration, initial encounter    New Prescriptions New Prescriptions   No medications on file     Dierdre Forth, PA-C 01/28/17 1610    Zadie Rhine, MD 01/28/17 [phone]

## 2017-01-28 NOTE — ED Triage Notes (Signed)
Pt endorses being intoxicated and fell down stairs earlier this evening causing a 2 cm laceration to her chin. Bleeding controlled. Pt denies loc. VSS.

## 2017-01-28 NOTE — Discharge Instructions (Signed)

## 2017-02-07 ENCOUNTER — Encounter (HOSPITAL_COMMUNITY): Payer: Self-pay | Admitting: Emergency Medicine

## 2017-02-07 ENCOUNTER — Emergency Department (HOSPITAL_COMMUNITY)
Admission: EM | Admit: 2017-02-07 | Discharge: 2017-02-07 | Disposition: A | Discharge status: Home / Self Care | Payer: Self-pay | Attending: Emergency Medicine | Admitting: Emergency Medicine

## 2017-02-07 DIAGNOSIS — Z79899 Other long term (current) drug therapy: Secondary | ICD-10-CM | POA: Insufficient documentation

## 2017-02-07 DIAGNOSIS — Z4802 Encounter for removal of sutures: Secondary | ICD-10-CM | POA: Insufficient documentation

## 2017-02-07 DIAGNOSIS — H6121 Impacted cerumen, right ear: Secondary | ICD-10-CM | POA: Insufficient documentation

## 2017-02-07 DIAGNOSIS — F17210 Nicotine dependence, cigarettes, uncomplicated: Secondary | ICD-10-CM | POA: Insufficient documentation

## 2017-02-07 MED ORDER — BACITRACIN-NEOMYCIN-POLYMYXIN 400-5-5000 EX OINT
TOPICAL_OINTMENT | Freq: Once | CUTANEOUS | Status: AC
  Administered 2017-02-07: 1 via TOPICAL
  Filled 2017-02-07: qty 1

## 2017-02-07 MED ORDER — CEPHALEXIN 500 MG PO CAPS: 500.0000 mg | ORAL_CAPSULE | Freq: Four times a day (QID) | ORAL | 0 refills | Status: DC

## 2017-02-07 NOTE — ED Triage Notes (Signed)
Sut[facility]28/17 [facility].  Here for removal.  Pt did c/o right ear pain 2 days after.  Rates discomfort 7/10.  Took OTC with not relief.

## 2017-02-07 NOTE — ED Notes (Signed)
Large amount of brown wax and white flakes removed in ear wash.  Tolerated well.

## 2017-02-07 NOTE — ED Provider Notes (Signed)
[facility] DEPT Provider Note   CSN: [id number] Arrival date & time: 02/07/17  0830     History   Chief Complaint Chief Complaint  Patient presents with  . Suture / Staple Removal    HPI Bonnie Yang is a 31 y.o. female who presents for suture removal for sutures placed on 01/28/2017. Patient states she continues to have some tenderness in drainage from the wound. She states it was a little red a few days ago. Patient has been washing with soapy water, but denies using any antibiotic ointment. She has been keeping it uncovered. She denies any fevers. Patient has had associated right ear pain that began 2 days after the fall. Patient notes that she has an impacted wisdom tooth that has caused her pain in the past, however. She is unsure if this is the cause of her pain. Patient states that she feels like she has water in her ear at night at times. Patient denies any other symptoms.Patient's has been taking over-the-counter medications without relief. Patient does not have a Education officer, communitydentist or insurance.  HPI  Past Medical History:  Diagnosis Date  . Anemia   . GERD (gastroesophageal reflux disease)   . Hepatitis C antibody test positive   . IV drug abuse   . Tobacco abuse   . UTI (urinary tract infection) 06/2016    Patient Active Problem List   Diagnosis Date Noted  . IV drug abuse   . Hepatitis C antibody test positive   . Cellulitis of elbow 06/10/2016  . UTI (lower urinary tract infection) 06/10/2016  . Heroin use 06/10/2016  . Facial cellulitis 04/02/2015    Past Surgical History:  Procedure Laterality Date  . ABCESS DRAINAGE    . ABCESS DRAINAGE  04/2016   nose   . I&D EXTREMITY Right 06/10/2016   Procedure: IRRIGATION AND DEBRIDEMENT ARM;  Surgeon: Betha LoaKevin Kuzma, MD;  Location: [facility] OR;  Service: Orthopedics;  Laterality: Right;    OB History    No data available       Home Medications    Prior to Admission medications   Medication Sig Start Date End Date Taking?  Authorizing Provider  Ginkgo Biloba 40 MG TABS Take 1 tablet by mouth.   Yes Historical Provider, MD  OVER THE COUNTER MEDICATION Fish oil - take 1 tablet by mouth daily   Yes Historical Provider, MD  OVER THE COUNTER MEDICATION Prenatal vitamin - take 1 tablet by mouth daily   Yes Historical Provider, MD  OVER THE COUNTER MEDICATION Probiotic - take 1 capsule by mouth dialy   Yes Historical Provider, MD  polyethylene glycol (MIRALAX / GLYCOLAX) packet Take 17 g by mouth 2 (two) times daily. 06/13/16  Yes Clanford Cyndie MullL Johnson, MD  amoxicillin (AMOXIL) 500 MG capsule Take 1 capsule (500 mg total) by mouth 3 (three) times daily. 10/12/16   Ivery QualeHobson Bryant, PA-C  amoxicillin-clavulanate (AUGMENTIN) 875-125 MG tablet Take 1 tablet by mouth every 12 (twelve) hours. 06/13/16   Clanford Cyndie MullL Johnson, MD  cephALEXin (KEFLEX) 500 MG capsule Take 1 capsule (500 mg total) by mouth 4 (four) times daily. 02/07/17   Emi HolesAlexandra M Letha Mirabal, PA-C  doxycycline (VIBRA-TABS) 100 MG tablet Take 1 tablet (100 mg total) by mouth every 12 (twelve) hours. 06/13/16   Clanford Cyndie MullL Johnson, MD  Lactobacillus (ACIDOPHILUS PROBIOTIC) 100 MG CAPS Take 1 capsule (100 mg total) by mouth 3 (three) times daily with meals. 06/13/16   Clanford Cyndie MullL Johnson, MD  oxyCODONE-acetaminophen (PERCOCET/ROXICET) 5-325 MG  tablet Take 1 tablet by mouth every 6 (six) hours as needed for moderate pain or severe pain. 06/13/16   Clanford Cyndie Mull, MD  senna-docusate (SENOKOT-S) 8.6-50 MG tablet Take 2 tablets by mouth 2 (two) times daily. 06/13/16   Clanford Cyndie Mull, MD  vitamin C (VITAMIN C) 1000 MG tablet Take 1 tablet (1,000 mg total) by mouth daily. 06/13/16   Clanford Cyndie Mull, MD    Family History Family History  Problem Relation Age of Onset  . Hypertension Mother   . Heart failure Mother     Social History Social History  Substance Use Topics  . Smoking status: Current Every Day Smoker    Packs/day: 0.50    Years: 10.00    Types: Cigarettes  .  Smokeless tobacco: Never Used  . Alcohol use Yes     Comment: 1I beer ocasionally     Allergies   Morphine and related   Review of Systems Review of Systems  Constitutional: Negative for fatigue.  HENT: Positive for dental problem and ear pain.   Skin: Positive for wound.     Physical Exam Updated Vital Signs BP 124/85   Pulse 94   Temp 97.8 F (36.6 C) (Oral)   Resp 18   Ht 5\' 5"  (1.651 m)   Wt 51.3 kg   LMP 01/19/2017 (Exact Date)   SpO2 100%   BMI 18.80 kg/m   Physical Exam  Constitutional: She appears well-developed and well-nourished. No distress.  HENT:  Head: Normocephalic and atraumatic.    Right Ear: No mastoid tenderness.  Left Ear: Tympanic membrane normal.  Mouth/Throat: Oropharynx is clear and moist. No oropharyngeal exudate.  R ear canal obstructed with cerumen  Eyes: Conjunctivae are normal. Pupils are equal, round, and reactive to light. Right eye exhibits no discharge. Left eye exhibits no discharge. No scleral icterus.  Neck: Normal range of motion. Neck supple. No thyromegaly present.  Cardiovascular: Normal rate, regular rhythm, normal heart sounds and intact distal pulses.  Exam reveals no gallop and no friction rub.   No murmur heard. Pulmonary/Chest: Effort normal and breath sounds normal. No stridor. No respiratory distress. She has no wheezes. She has no rales.  Abdominal: Soft. Bowel sounds are normal. She exhibits no distension. There is no tenderness. There is no rebound and no guarding.  Musculoskeletal: She exhibits no edema.  Lymphadenopathy:    She has no cervical adenopathy.  Neurological: She is alert. Coordination normal.  Skin: Skin is warm and dry. No rash noted. She is not diaphoretic. No pallor.  Wound to chin: tender to palpation with some drainage, no significant erythema; see image  Psychiatric: She has a normal mood and affect.  Nursing note and vitals reviewed.      ED Treatments / Results  Labs (all labs  ordered are listed, but only abnormal results are displayed) Labs Reviewed - No data to display  EKG  EKG Interpretation None       Radiology No results found.  Procedures Procedures (including critical care time)  Medications Ordered in ED Medications  neomycin-bacitracin-polymyxin (NEOSPORIN) ointment (1 application Topical Given 02/07/17 0934)     Initial Impression / Assessment and Plan / ED Course  I have reviewed the triage vital signs and the nursing notes.  Pertinent labs & imaging results that were available during my care of the patient were reviewed by me and considered in my medical decision making (see chart for details).     Patient presenting for suture removal.  Patient was told to return on 02/02/2017. Patient has not used any antibiotic at home and wound does seem to have mild infection. Will treat with antibiotic ointment and Keflex. Patient also with cerumen impaction. Patient's ear symptoms completely resolved after irrigation completed by nursing staff. Irrigation yielded a moderate amount of waxy material. R TM is clear upon re-examination following irrigation. Wound care provided in the ED and discussed for home. Return precautions discussed. Patient understands and agrees with plan. Patient vitals stable throughout ED course discharged in satisfactory condition.  Final Clinical Impressions(s) / ED Diagnoses   Final diagnoses:  Visit for suture removal  Impacted cerumen of right ear    New Prescriptions New Prescriptions   CEPHALEXIN (KEFLEX) 500 MG CAPSULE    Take 1 capsule (500 mg total) by mouth 4 (four) times daily.     [address] PA-C 02/07/17 6962    Vanetta Mulders, MD 02/08/17 [phone]

## 2017-02-07 NOTE — Discharge Instructions (Signed)
Medications: Keflex  Treatment: Take Keflex 4 times daily for 5 days. Make sure to finish all of this medication. Wash your wound daily with warm soapy water. Apply antibiotic ointment twice daily. Keep covered, especially when sleeping to keep from bumping the wound.  Follow-up:  Please follow-up with a dentist as soon as possible for further evaluation and treatment of your impacted wisdom teeth. Please return to emergency department if you develop any new or worsening symptoms including fever, increasing pain, redness, swelling, drainage from the wound.

## 2017-03-09 ENCOUNTER — Emergency Department (HOSPITAL_COMMUNITY)
Admission: EM | Admit: 2017-03-09 | Discharge: 2017-03-09 | Disposition: A | Discharge status: Home / Self Care | Payer: Self-pay | Attending: Emergency Medicine | Admitting: Emergency Medicine

## 2017-03-09 DIAGNOSIS — B9689 Other specified bacterial agents as the cause of diseases classified elsewhere: Secondary | ICD-10-CM

## 2017-03-09 DIAGNOSIS — N3000 Acute cystitis without hematuria: Secondary | ICD-10-CM | POA: Insufficient documentation

## 2017-03-09 DIAGNOSIS — F17210 Nicotine dependence, cigarettes, uncomplicated: Secondary | ICD-10-CM | POA: Insufficient documentation

## 2017-03-09 DIAGNOSIS — N760 Acute vaginitis: Secondary | ICD-10-CM | POA: Insufficient documentation

## 2017-03-09 DIAGNOSIS — Z79899 Other long term (current) drug therapy: Secondary | ICD-10-CM | POA: Insufficient documentation

## 2017-03-09 LAB — URINALYSIS, ROUTINE W REFLEX MICROSCOPIC
BILIRUBIN URINE: NEGATIVE
GLUCOSE, UA: NEGATIVE mg/dL
Hgb urine dipstick: NEGATIVE
KETONES UR: NEGATIVE mg/dL
Nitrite: NEGATIVE
PH: 5 (ref 5.0–8.0)
Protein, ur: NEGATIVE mg/dL
SPECIFIC GRAVITY, URINE: 1.01 (ref 1.005–1.030)

## 2017-03-09 LAB — PREGNANCY, URINE: Preg Test, Ur: NEGATIVE

## 2017-03-09 LAB — WET PREP, GENITAL
SPERM: NONE SEEN
TRICH WET PREP: NONE SEEN
Yeast Wet Prep HPF POC: NONE SEEN

## 2017-03-09 MED ORDER — SULFAMETHOXAZOLE-TRIMETHOPRIM 800-160 MG PO TABS: 1.0000 | ORAL_TABLET | Freq: Two times a day (BID) | ORAL | 0 refills | Status: AC

## 2017-03-09 MED ORDER — METRONIDAZOLE 500 MG PO TABS
500.0000 mg | ORAL_TABLET | Freq: Two times a day (BID) | ORAL | 0 refills | Status: DC
Start: 2017-03-09 — End: 2017-12-05

## 2017-03-09 NOTE — Discharge Instructions (Signed)
Please obtain all of your results from medical records or have your doctors office obtain the results - share them with your doctor - you should be seen at your doctors office in the next 2 days. Call today to arrange your follow up. Take the medications as prescribed. Please review all of the medicines and only take them if you do not have an allergy to them. Please be aware that if you are taking birth control pills, taking other prescriptions, ESPECIALLY ANTIBIOTICS may make the birth control ineffective - if this is the case, either do not engage in sexual activity or use alternative methods of birth control such as condoms until you have finished the medicine and your family doctor says it is OK to restart them. If you are on a blood thinner such as COUMADIN, be aware that any other medicine that you take may cause the coumadin to either work too much, or not enough - you should have your coumadin level rechecked in next 7 days if this is the case.  °?  °It is also a possibility that you have an allergic reaction to any of the medicines that you have been prescribed - Everybody reacts differently to medications and while MOST people have no trouble with most medicines, you may have a reaction such as nausea, vomiting, rash, swelling, shortness of breath. If this is the case, please stop taking the medicine immediately and contact your physician.  °?  °You should return to the ER if you develop severe or worsening symptoms.  ° °[facility] Doctor List ° ° ° °Edward Hawkins MD. Specialty: Pulmonary Disease Contact information: [address]  °[address]  °[place] [address]  °[phone]  ° °Margaret Simpson, MD. Specialty: Family Medicine Contact information: [address]  °[place] [address]  °[phone]  ° °Scott Luking, MD. Specialty: Family Medicine Contact information: [address]  °[address]  °[place] [address]  °[phone]  ° °Tesfaye Fanta, MD Specialty:  Internal Medicine Contact information: [address]  °[place] [address]  °[phone]  ° °Zach Hall, MD. Specialty: Internal Medicine Contact information: [address]  °[place] [address]  °[phone]  ° °Angus Mcinnis, MD. Specialty: Family Medicine Contact information: [address]  °[place]  [address]  °[phone]  ° °Stephen Knowlton, MD. Specialty: Family Medicine Contact information: [address]  °[address]  °[place]  [address]  °[phone]  ° °Roy Fagan, MD. Specialty: Internal Medicine Contact information: [address]  °[address]  °  [address]  °[phone]  ° °

## 2017-03-09 NOTE — ED Triage Notes (Signed)
Pt reports that she used Monistat 3 day course approx 1 1/2 weeks ago. Vagina conts to itch. No drainage or smell. Feels pressure with urination

## 2017-03-09 NOTE — ED Provider Notes (Signed)
[facility]-EM[facility]ENCY DEPT Provider Note   CSN: [id number] Arrival date & time: 03/09/17  0709     History   Chief Complaint Chief Complaint  Patient presents with  . Vaginal Itching    HPI Bonnie Yang is a 31 y.o. female.  HPI  Pt states 4 days ago developed vaginal itching and small am't of d/c She tried OTC yeast infection Monistat with some relief but came back and now has itching - she states that she takes lots of bubble baths and has had some dysuria (mild).  No fevers, vomiting.  Sx are persistent, gradually worsening and not associated with abd pain / n/v.  She has hx of Hep C but has not pursued treatment and has no [place] doctor - recently got insurance and will get PCP this week.  Past Medical History:  Diagnosis Date  . Anemia   . GERD (gastroesophageal reflux disease)   . Hepatitis C antibody test positive   . IV drug abuse   . Tobacco abuse   . UTI (urinary tract infection) 06/2016    Patient Active Problem List   Diagnosis Date Noted  . IV drug abuse   . Hepatitis C antibody test positive   . Cellulitis of elbow 06/10/2016  . UTI (lower urinary tract infection) 06/10/2016  . Heroin use 06/10/2016  . Facial cellulitis 04/02/2015    Past Surgical History:  Procedure Laterality Date  . ABCESS DRAINAGE    . ABCESS DRAINAGE  04/2016   nose   . I&D EXTREMITY Right 06/10/2016   Procedure: IRRIGATION AND DEBRIDEMENT ARM;  Surgeon: Betha Loa, MD;  Location: [facility] OR;  Service: Orthopedics;  Laterality: Right;    OB History    No data available       Home Medications    Prior to Admission medications   Medication Sig Start Date End Date Taking? Authorizing Provider  amoxicillin (AMOXIL) 500 MG capsule Take 1 capsule (500 mg total) by mouth 3 (three) times daily. 10/12/16   Ivery Quale, PA-C  amoxicillin-clavulanate (AUGMENTIN) 875-125 MG tablet Take 1 tablet by mouth every 12 (twelve) hours. 06/13/16   Clanford Cyndie Mull, MD  cephALEXin (KEFLEX) 500 MG  capsule Take 1 capsule (500 mg total) by mouth 4 (four) times daily. 02/07/17   Emi Holes, PA-C  doxycycline (VIBRA-TABS) 100 MG tablet Take 1 tablet (100 mg total) by mouth every 12 (twelve) hours. 06/13/16   Clanford Cyndie Mull, MD  Ginkgo Biloba 40 MG TABS Take 1 tablet by mouth.    Historical Provider, MD  Lactobacillus (ACIDOPHILUS PROBIOTIC) 100 MG CAPS Take 1 capsule (100 mg total) by mouth 3 (three) times daily with meals. 06/13/16   Clanford Cyndie Mull, MD  metroNIDAZOLE (FLAGYL) 500 MG tablet Take 1 tablet (500 mg total) by mouth 2 (two) times daily. 03/09/17   Eber Hong, MD  OVER THE COUNTER MEDICATION Fish oil - take 1 tablet by mouth daily    Historical Provider, MD  OVER THE COUNTER MEDICATION Prenatal vitamin - take 1 tablet by mouth daily    Historical Provider, MD  OVER THE COUNTER MEDICATION Probiotic - take 1 capsule by mouth dialy    Historical Provider, MD  oxyCODONE-acetaminophen (PERCOCET/ROXICET) 5-325 MG tablet Take 1 tablet by mouth every 6 (six) hours as needed for moderate pain or severe pain. 06/13/16   Clanford Cyndie Mull, MD  polyethylene glycol (MIRALAX / GLYCOLAX) packet Take 17 g by mouth 2 (two) times daily. 06/13/16   Clanford L  Laural Benes, MD  senna-docusate (SENOKOT-S) 8.6-50 MG tablet Take 2 tablets by mouth 2 (two) times daily. 06/13/16   Clanford Cyndie Mull, MD  sulfamethoxazole-trimethoprim (BACTRIM DS,SEPTRA DS) 800-160 MG tablet Take 1 tablet by mouth 2 (two) times daily. 03/09/17 03/14/17  Eber Hong, MD  vitamin C (VITAMIN C) 1000 MG tablet Take 1 tablet (1,000 mg total) by mouth daily. 06/13/16   Clanford Cyndie Mull, MD    Family History Family History  Problem Relation Age of Onset  . Hypertension Mother   . Heart failure Mother     Social History Social History  Substance Use Topics  . Smoking status: Current Every Day Smoker    Packs/day: 0.50    Years: 10.00    Types: Cigarettes  . Smokeless tobacco: Never Used  . Alcohol use Yes     Comment: 1I  beer ocasionally     Allergies   Morphine and related   Review of Systems Review of Systems  Constitutional: Negative for fever.  Gastrointestinal: Negative for abdominal pain, diarrhea, nausea and vomiting.  Genitourinary: Positive for dysuria, vaginal discharge and vaginal pain. Negative for flank pain.     Physical Exam Updated Vital Signs BP (!) 119/57   Pulse 82   Temp 97.9 F (36.6 C) (Oral)   Resp 16   Ht  (1.651 m)   Wt 115 lb (52.2 kg)   LMP 02/20/2017   SpO2 100%   BMI 19.14 kg/m   Physical Exam  Constitutional: She appears well-developed and well-nourished.  HENT:  Head: Normocephalic and atraumatic.  Eyes: Conjunctivae are normal. Right eye exhibits no discharge. Left eye exhibits no discharge.  Pulmonary/Chest: Effort normal. No respiratory distress.  Abdominal: Soft. She exhibits no distension and no mass. There is no tenderness. There is no guarding.  Genitourinary:  Genitourinary Comments: Normal external genitalia Normal internal exam except for mild white d/c No CMT, no bleeding, no FB, no adnexal ttp or masses Chaperone present for exam.  Neurological: She is alert. Coordination normal.  Skin: Skin is warm and dry. No rash noted. She is not diaphoretic. No erythema.  Psychiatric: She has a normal mood and affect.  Nursing note and vitals reviewed.    ED Treatments / Results  Labs (all labs ordered are listed, but only abnormal results are displayed) Labs Reviewed  WET PREP, GENITAL - Abnormal; Notable for the following:       Result Value   Clue Cells Wet Prep HPF POC PRESENT (*)    WBC, Wet Prep HPF POC MANY (*)    All other components within normal limits  URINALYSIS, ROUTINE W REFLEX MICROSCOPIC - Abnormal; Notable for the following:    APPearance CLOUDY (*)    Leukocytes, UA LARGE (*)    Bacteria, UA RARE (*)    Squamous Epithelial / LPF 6-30 (*)    All other components within normal limits  PREGNANCY, URINE  GC/CHLAMYDIA  PROBE AMP ([place]) NOT AT [facility]     Radiology No results found.  Procedures Procedures (including critical care time)  Medications Ordered in ED Medications - No data to display   Initial Impression / Assessment and Plan / ED Course  I have reviewed the triage vital signs and the nursing notes.  Pertinent labs & imaging results that were available during my care of the patient were reviewed by me and considered in my medical decision making (see chart for details).  Clinical Course as of Mar 09 812  Fri Mar 09, 2017  0810 Wet Prep shows BV, no yeast, no trich and exam non concening for other STD.  UA shows TNTC bacteria as well.  Will treat for UTI as well, seems uncomplicated in young otherwise healthy female.  [BM]    Clinical Course User Index [BM] Eber Hong, MD    Will evaluate for yeast / vaginal infection -  UA sent to r/o infection Pt is very well appearin overall - she was counseled on need for f/u with PCP for Hep C f/u.  She ezxpressed her understanding.  Final Clinical Impressions(s) / ED Diagnoses   Final diagnoses:  Bacterial vaginitis  Acute cystitis without hematuria    New Prescriptions New Prescriptions   METRONIDAZOLE (FLAGYL) 500 MG TABLET    Take 1 tablet (500 mg total) by mouth 2 (two) times daily.   SULFAMETHOXAZOLE-TRIMETHOPRIM (BACTRIM DS,SEPTRA DS) 800-160 MG TABLET    Take 1 tablet by mouth 2 (two) times daily.     Eber Hong, MD 03/09/17 [phone]

## 2017-03-12 LAB — GC/CHLAMYDIA PROBE AMP (~~LOC~~) NOT AT ARMC
CHLAMYDIA, DNA PROBE: NEGATIVE
NEISSERIA GONORRHEA: NEGATIVE

## 2017-11-02 ENCOUNTER — Encounter: Payer: Self-pay | Admitting: Obstetrics and Gynecology

## 2017-11-02 ENCOUNTER — Ambulatory Visit (INDEPENDENT_AMBULATORY_CARE_PROVIDER_SITE_OTHER): Payer: 59 | Admitting: Obstetrics and Gynecology

## 2017-11-02 VITALS — BP 90/60 | HR 97 | Ht 65.0 in | Wt 108.4 lb

## 2017-11-02 DIAGNOSIS — Z01419 Encounter for gynecological examination (general) (routine) without abnormal findings: Secondary | ICD-10-CM | POA: Diagnosis not present

## 2017-11-02 DIAGNOSIS — N854 Malposition of uterus: Secondary | ICD-10-CM

## 2017-11-02 NOTE — Progress Notes (Signed)
[facility] Visit  11/02/17        Patient name: Bonnie PicklesVeronica L Pierre     MRN [id number]  Date of birth: 04/30/1986  CC & HPI:  Bonnie Yang is a 31 y.o. female presenting today for abnormal periods onset 2 months. She notes that she has her menstrual cycle every 20 days for the past 2 months. She had the depo-provera last year and discontinued use last year. She is not currently on OCP at this time. She denies being sexually active for the past 6 months. She was tested for possible STD's in April 2018 following completion of her prior sexual relationship. She has a family hx of early menopause with her mother going through menopause at age 33 and her aunt completed menopause at age 33. Her last pap was in June 2017. Pt reports associated night sweats x 9 months that gradually occur while asleep. She reports that she has hot flashes prior to a sudden onset of chills. Pt has not tried any medications for the relief of her symptoms. She denies any other symptoms.      ROS:  ROS  +Abnormal menstrual cycles +Hot flashes +Night sweats All systems are negative except as noted in the HPI and PMH.    Pertinent History Reviewed:   Reviewed: Significant for Anemia, Hepatitis C, IVDA,  Medical         Past Medical History:  Diagnosis Date  . Anemia   . GERD (gastroesophageal reflux disease)   . Hepatitis C antibody test positive   . IV drug abuse (HCC)   . Tobacco abuse   . UTI (urinary tract infection) 06/2016                              Surgical Hx:    Past Surgical History:  Procedure Laterality Date  . ABCESS DRAINAGE    . ABCESS DRAINAGE  04/2016   nose   . I&D EXTREMITY Right 06/10/2016   Procedure: IRRIGATION AND DEBRIDEMENT ARM;  Surgeon: Betha LoaKevin Kuzma, MD;  [facility];  Service: Orthopedics;  Laterality: Right;   Medications: Reviewed & Updated - see associated section                       Current Outpatient Medications:  .  Ginkgo Biloba 40 MG TABS, Take 1 tablet by  mouth., Disp: , Rfl:  .  Lactobacillus (ACIDOPHILUS PROBIOTIC) 100 MG CAPS, Take 1 capsule (100 mg total) by mouth 3 (three) times daily with meals., Disp: 90 capsule, Rfl: 0 .  OVER THE COUNTER MEDICATION, Fish oil - take 1 tablet by mouth daily, Disp: , Rfl:  .  OVER THE COUNTER MEDICATION, Prenatal vitamin - take 1 tablet by mouth daily, Disp: , Rfl:  .  OVER THE COUNTER MEDICATION, Probiotic - take 1 capsule by mouth dialy, Disp: , Rfl:  .  amoxicillin (AMOXIL) 500 MG capsule, Take 1 capsule (500 mg total) by mouth 3 (three) times daily. (Patient not taking: Reported on 11/02/2017), Disp: 21 capsule, Rfl: 0 .  amoxicillin-clavulanate (AUGMENTIN) 875-125 MG tablet, Take 1 tablet by mouth every 12 (twelve) hours. (Patient not taking: Reported on 11/02/2017), Disp: 14 tablet, Rfl: 0 .  cephALEXin (KEFLEX) 500 MG capsule, Take 1 capsule (500 mg total) by mouth 4 (four) times daily. (Patient not taking: Reported on 11/02/2017), Disp: 20 capsule, Rfl: 0 .  doxycycline (VIBRA-TABS) 100  MG tablet, Take 1 tablet (100 mg total) by mouth every 12 (twelve) hours. (Patient not taking: Reported on 11/02/2017), Disp: 14 tablet, Rfl: 0 .  metroNIDAZOLE (FLAGYL) 500 MG tablet, Take 1 tablet (500 mg total) by mouth 2 (two) times daily. (Patient not taking: Reported on 11/02/2017), Disp: 14 tablet, Rfl: 0 .  oxyCODONE-acetaminophen (PERCOCET/ROXICET) 5-325 MG tablet, Take 1 tablet by mouth every 6 (six) hours as needed for moderate pain or severe pain. (Patient not taking: Reported on 11/02/2017), Disp: 12 tablet, Rfl: 0 .  polyethylene glycol (MIRALAX / GLYCOLAX) packet, Take 17 g by mouth 2 (two) times daily. (Patient not taking: Reported on 11/02/2017), Disp: 14 each, Rfl: 0 .  senna-docusate (SENOKOT-S) 8.6-50 MG tablet, Take 2 tablets by mouth 2 (two) times daily. (Patient not taking: Reported on 11/02/2017), Disp: 40 tablet, Rfl: 0 .  vitamin C (VITAMIN C) 1000 MG tablet, Take 1 tablet (1,000 mg total) by mouth  daily. (Patient not taking: Reported on 11/02/2017), Disp: , Rfl:    Social History: Reviewed -  reports that she has been smoking cigarettes.  She has a 5.00 pack-year smoking history. she has never used smokeless tobacco.  Objective Findings:  Vitals: Blood pressure 90/60, pulse 97, height 5\' 5"  (1.651 m), weight 108 lb 6.4 oz (49.2 kg), last menstrual period 10/08/2017.  Physical Examination: General appearance - alert, well appearing, and in no distress and oriented to person, place, and time Mental status - alert, oriented to person, place, and time, normal mood, behavior, speech, dress, motor activity, and thought processes Pelvic - normal external genitalia, vulva, vagina, cervix, uterus and adnexa, VULVA: normal appearing vulva with no masses, tenderness or lesions VAGINA: normal appearing vagina with normal color and discharge, no lesions Secretions nl appearing.  CERVIX: normal appearing cervix without discharge or lesions  UTERUS: uterus is normal size, shape, consistency and nontender, retroverted ADNEXA: normal adnexa in size, nontender and no masses   Assessment & Plan:   A:  1. Nl pelvic exam 2. Uterine retroversion 3. No evidence of post-menopausal status  P:  1. Follow up PRN if 3 months without a period    By signing my name below, I, Soijett Blue, attest that this documentation has been prepared under the direction and in the presence of Tilda BurrowFerguson, Yacine Garriga V, MD. Electronically Signed: Soijett Blue, ED Scribe. 11/02/17. 9:57 AM.  I personally performed the services described in this documentation, which was SCRIBED in my presence. The recorded information has been reviewed and considered accurate. It has been edited as necessary during review. Tilda BurrowJohn V Aviah Sorci, MD

## 2017-12-05 ENCOUNTER — Encounter (HOSPITAL_COMMUNITY): Payer: Self-pay | Admitting: Cardiology

## 2017-12-05 ENCOUNTER — Emergency Department (HOSPITAL_COMMUNITY)
Admission: EM | Admit: 2017-12-05 | Discharge: 2017-12-05 | Disposition: A | Discharge status: Home / Self Care | Payer: 59 | Attending: Emergency Medicine | Admitting: Emergency Medicine

## 2017-12-05 DIAGNOSIS — R197 Diarrhea, unspecified: Secondary | ICD-10-CM | POA: Insufficient documentation

## 2017-12-05 DIAGNOSIS — R112 Nausea with vomiting, unspecified: Secondary | ICD-10-CM | POA: Diagnosis not present

## 2017-12-05 DIAGNOSIS — F17210 Nicotine dependence, cigarettes, uncomplicated: Secondary | ICD-10-CM | POA: Insufficient documentation

## 2017-12-05 DIAGNOSIS — Z79899 Other long term (current) drug therapy: Secondary | ICD-10-CM | POA: Diagnosis not present

## 2017-12-05 LAB — CBC WITH DIFFERENTIAL/PLATELET
BASOS ABS: 0 10*3/uL (ref 0.0–0.1)
BASOS PCT: 0 %
EOS PCT: 3 %
Eosinophils Absolute: 0.2 10*3/uL (ref 0.0–0.7)
HCT: 41.6 % (ref 36.0–46.0)
Hemoglobin: 13.3 g/dL (ref 12.0–15.0)
Lymphocytes Relative: 8 %
Lymphs Abs: 0.7 10*3/uL (ref 0.7–4.0)
MCH: 28.3 pg (ref 26.0–34.0)
MCHC: 32 g/dL (ref 30.0–36.0)
MCV: 88.5 fL (ref 78.0–100.0)
MONO ABS: 0.5 10*3/uL (ref 0.1–1.0)
Monocytes Relative: 6 %
Neutro Abs: 6.7 10*3/uL (ref 1.7–7.7)
Neutrophils Relative %: 83 %
PLATELETS: 218 10*3/uL (ref 150–400)
RBC: 4.7 MIL/uL (ref 3.87–5.11)
RDW: 14 % (ref 11.5–15.5)
WBC: 8.1 10*3/uL (ref 4.0–10.5)

## 2017-12-05 LAB — URINALYSIS, ROUTINE W REFLEX MICROSCOPIC
BILIRUBIN URINE: NEGATIVE
Glucose, UA: NEGATIVE mg/dL
Ketones, ur: NEGATIVE mg/dL
Nitrite: NEGATIVE
PROTEIN: 100 mg/dL — AB
SPECIFIC GRAVITY, URINE: 1.032 — AB (ref 1.005–1.030)
pH: 5 (ref 5.0–8.0)

## 2017-12-05 LAB — COMPREHENSIVE METABOLIC PANEL
ALT: 52 U/L (ref 14–54)
AST: 36 U/L (ref 15–41)
Albumin: 4.4 g/dL (ref 3.5–5.0)
Alkaline Phosphatase: 48 U/L (ref 38–126)
Anion gap: 8 (ref 5–15)
BILIRUBIN TOTAL: 0.9 mg/dL (ref 0.3–1.2)
BUN: 15 mg/dL (ref 6–20)
CO2: 26 mmol/L (ref 22–32)
CREATININE: 0.84 mg/dL (ref 0.44–1.00)
Calcium: 9.2 mg/dL (ref 8.9–10.3)
Chloride: 104 mmol/L (ref 101–111)
GFR calc Af Amer: >60 mL/min (ref >60)
Glucose, Bld: 109 mg/dL — ABNORMAL HIGH (ref 65–99)
Potassium: 3.8 mmol/L (ref 3.5–5.1)
Sodium: 138 mmol/L (ref 135–145)
TOTAL PROTEIN: 8 g/dL (ref 6.5–8.1)

## 2017-12-05 LAB — C DIFFICILE QUICK SCREEN W PCR REFLEX
C DIFFICILE (CDIFF) INTERP: NOT DETECTED
C Diff antigen: NEGATIVE
C Diff toxin: NEGATIVE

## 2017-12-05 LAB — LIPASE, BLOOD: LIPASE: 23 U/L (ref 11–51)

## 2017-12-05 LAB — PREGNANCY, URINE: Preg Test, Ur: NEGATIVE

## 2017-12-05 MED ORDER — ONDANSETRON HCL 4 MG/2ML IJ SOLN
4.0000 mg | Freq: Once | INTRAMUSCULAR | Status: AC
  Administered 2017-12-05: 4 mg via INTRAVENOUS
  Filled 2017-12-05: qty 2

## 2017-12-05 MED ORDER — DIPHENOXYLATE-ATROPINE 2.5-0.025 MG PO TABS: 1.0000 | ORAL_TABLET | Freq: Four times a day (QID) | ORAL | 0 refills | Status: DC | PRN

## 2017-12-05 MED ORDER — DIPHENOXYLATE-ATROPINE 2.5-0.025 MG PO TABS
2.0000 | ORAL_TABLET | Freq: Once | ORAL | Status: AC
  Administered 2017-12-05: 2 via ORAL
  Filled 2017-12-05: qty 2

## 2017-12-05 MED ORDER — ONDANSETRON 4 MG PO TBDP: 4.0000 mg | ORAL_TABLET | Freq: Three times a day (TID) | ORAL | 0 refills | Status: DC | PRN

## 2017-12-05 MED ORDER — SODIUM CHLORIDE 0.9 % IV BOLUS (SEPSIS)
1000.0000 mL | Freq: Once | INTRAVENOUS | Status: AC
  Administered 2017-12-05: 1000 mL via INTRAVENOUS

## 2017-12-05 NOTE — ED Notes (Signed)
Patient given discharge instruction, verbalized understand. IV removed, band aid applied. Patient ambulatory out of the department.  

## 2017-12-05 NOTE — Discharge Instructions (Signed)
As discussed, use the medicines prescribed if you have continued nausea or diarrhea.  Make sure you are drinking plenty of fluids and maintain a bland diet for the next several days.  Brat diet as discussed (bananas, rice, applesauce and toast) are especially helpful with your symptoms.

## 2017-12-05 NOTE — ED Triage Notes (Signed)
Diarrhea and nausea since 2 am.  Mother just admitted with the same.

## 2017-12-05 NOTE — ED Provider Notes (Signed)
[facility]ENCY [facility] Provider Note   CSN: [id number] Arrival date & time: 12/05/17  [id number]     History   Chief Complaint Chief Complaint  Patient presents with  . Diarrhea    HPI Bonnie Yang is a 32 y.o. female with a history of anemia, GERD, hepatitis C and distant history of IV drug abuse presenting with diarrhea which started around 2 AM today.  She reports back to back episodes of diarrhea, reporting 3 episodes in the 30 minutes prior to arrival here.  She has abdominal cramping which improves after a BM and denies bloody stools.  She reports feeling well yesterday, although perhaps a little fatigued.  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  Additionally her mother presented here earlier today for complaints including diarrhea and was admitted due to an elevated troponin level, but is yet to be determined if she has C. difficile.  The patient has had no documented fevers but reports diaphoresis during diarrheal episodes.  She has nausea without emesis, denies chest pain, shortness of breath.  No recent antibiotic use or travel.  She took Imodium and ginger ale prior to arrival with no improvement.  The history is provided by the patient.    Past Medical History:  Diagnosis Date  . Anemia   . GERD (gastroesophageal reflux disease)   . Hepatitis C antibody test positive   . IV drug abuse (HCC)   . Tobacco abuse   . UTI (urinary tract infection) 06/2016    Patient Active Problem List   Diagnosis Date Noted  . IV drug abuse (HCC)   . Hepatitis C antibody test positive   . Cellulitis of elbow 06/10/2016  . UTI (lower urinary tract infection) 06/10/2016  . Heroin use 06/10/2016  . Facial cellulitis 04/02/2015    Past Surgical History:  Procedure Laterality Date  . ABCESS DRAINAGE    . ABCESS DRAINAGE  04/2016   nose   . I&D EXTREMITY Right 06/10/2016   Procedure: IRRIGATION AND DEBRIDEMENT ARM;  Surgeon: Betha LoaKevin  Kuzma, MD;  [facility];  Service: Orthopedics;  Laterality: Right;    OB History    No data available       Home Medications    Prior to Admission medications   Medication Sig Start Date End Date Taking? Authorizing Provider  b complex vitamins tablet Take 1 tablet by mouth daily.   Yes [provider]  Ginkgo Biloba 40 MG TABS Take 1 tablet by mouth.   Yes [provider]  Omega-3 Fatty Acids (FISH OIL PO) Take 1 capsule by mouth daily.   Yes [provider]  Prenatal Vit-Fe Fumarate-FA (PRENATAL MULTIVITAMIN) TABS tablet Take 1 tablet by mouth daily at 12 noon.   Yes [provider]  diphenoxylate-atropine (LOMOTIL) 2.5-0.025 MG tablet Take 1 tablet by mouth 4 (four) times daily as needed for diarrhea or loose stools. 12/05/17   Shanitra Phillippi, Raynelle FanningJulie, PA-C  ondansetron (ZOFRAN ODT) 4 MG disintegrating tablet Take 1 tablet (4 mg total) by mouth every 8 (eight) hours as needed for nausea or vomiting. 12/05/17   Burgess AmorIdol, Ellwood Steidle, PA-C    Family History Family History  Problem Relation Age of Onset  . Hypertension Mother   . Heart failure Mother     Social History Social History   Tobacco Use  . Smoking status: Current Every Day Smoker    Packs/day: 0.50    Years: 10.00  Pack years: 5.00    Types: Cigarettes  . Smokeless tobacco: Never Used  Substance Use Topics  . Alcohol use: Yes    Comment: 1I beer ocasionally  . Drug use: Yes    Types: Marijuana    Comment: occ, last cocaine use 11/25/16     Allergies   Morphine and related   Review of Systems Review of Systems  Constitutional: Positive for diaphoresis. Negative for fever.  HENT: Negative for congestion and sore throat.   Eyes: Negative.   Respiratory: Negative for chest tightness and shortness of breath.   Cardiovascular: Negative for chest pain.  Gastrointestinal: Positive for diarrhea and nausea. Negative for abdominal pain and vomiting.  Genitourinary: Negative.     Musculoskeletal: Negative for arthralgias, joint swelling and neck pain.  Skin: Negative.  Negative for rash and wound.  Neurological: Negative for dizziness, weakness, light-headedness, numbness and headaches.  Psychiatric/Behavioral: Negative.      Physical Exam Updated Vital Signs BP 120/79 (BP Location: Right Arm)   Pulse 78   Temp 98.4 F (36.9 C) (Oral)   Resp 18   Ht 5\' 5"  (1.651 m)   Wt 47.6 kg (105 lb)   LMP 12/04/2017   SpO2 100%   BMI 17.47 kg/m   Physical Exam  Constitutional: She appears well-developed and well-nourished.  HENT:  Head: Normocephalic and atraumatic.  Eyes: Conjunctivae are normal.  Neck: Normal range of motion.  Cardiovascular: Normal rate, regular rhythm, normal heart sounds and intact distal pulses.  Pulmonary/Chest: Effort normal and breath sounds normal. She has no wheezes.  Abdominal: Soft. She exhibits no distension. Bowel sounds are increased. There is no tenderness.  Musculoskeletal: Normal range of motion.  Neurological: She is alert.  Skin: Skin is warm and dry.  Psychiatric: She has a normal mood and affect.  Nursing note and vitals reviewed.    ED Treatments / Results  Labs (all labs ordered are listed, but only abnormal results are displayed) Labs Reviewed  COMPREHENSIVE METABOLIC PANEL - Abnormal; Notable for the following components:      Result Value   Glucose, Bld 109 (*)    All other components within normal limits  URINALYSIS, ROUTINE W REFLEX MICROSCOPIC - Abnormal; Notable for the following components:   APPearance CLOUDY (*)    Specific Gravity, Urine 1.032 (*)    Hgb urine dipstick LARGE (*)    Protein, ur 100 (*)    Leukocytes, UA SMALL (*)    Bacteria, UA RARE (*)    Squamous Epithelial / LPF 6-30 (*)    All other components within normal limits  C DIFFICILE QUICK SCREEN W PCR REFLEX  GASTROINTESTINAL PANEL BY PCR, STOOL (REPLACES STOOL CULTURE)  LIPASE, BLOOD  CBC WITH DIFFERENTIAL/PLATELET   PREGNANCY, URINE    EKG  EKG Interpretation None       Radiology No results found.  Procedures Procedures (including critical care time)  Medications Ordered in ED Medications  sodium chloride 0.9 % bolus 1,000 mL (0 mLs Intravenous Stopped 12/05/17 1200)  diphenoxylate-atropine (LOMOTIL) 2.5-0.025 MG per tablet 2 tablet (2 tablets Oral Given 12/05/17 1215)  ondansetron (ZOFRAN) injection 4 mg (4 mg Intravenous Given 12/05/17 1213)     Initial Impression / Assessment and Plan / ED Course  I have reviewed the triage vital signs and the nursing notes.  Pertinent labs & imaging results that were available during my care of the patient were reviewed by me and considered in my medical decision making (see chart for details).  Labs reviewed including urinalysis.  Patient is currently on her menses hence RBCs in the specimen.  She denies urinary symptoms.  She was given Lomotil and Zofran and had complete resolution of symptoms with no further nausea or diarrhea in the department.  She tolerated p.o. intake.  Her C. difficile PCR was negative today.  I suspect her symptoms are viral.  She was advised increased fluid intake, discussed brat diet, bland foods.  Plan follow-up here or with PCP for any persistent or worsening symptoms.  Reexam with nonacute abdomen, no distention, abdomen is soft and nontender.  Final Clinical Impressions(s) / ED Diagnoses   Final diagnoses:  Nausea vomiting and diarrhea    ED Discharge Orders        Ordered    diphenoxylate-atropine (LOMOTIL) 2.5-0.025 MG tablet  4 times daily PRN     12/05/17 1302    ondansetron (ZOFRAN ODT) 4 MG disintegrating tablet  Every 8 hours PRN     12/05/17 1302       Burgess Amor, PA-C 12/05/17 1308    Doug Sou, MD 12/05/17 1550

## 2017-12-06 LAB — GASTROINTESTINAL PANEL BY PCR, STOOL (REPLACES STOOL CULTURE)
ADENOVIRUS F40/41: NOT DETECTED
Astrovirus: NOT DETECTED
Campylobacter species: NOT DETECTED
Cryptosporidium: NOT DETECTED
Cyclospora cayetanensis: NOT DETECTED
ENTEROAGGREGATIVE E COLI (EAEC): NOT DETECTED
ENTEROPATHOGENIC E COLI (EPEC): NOT DETECTED
Entamoeba histolytica: NOT DETECTED
Enterotoxigenic E coli (ETEC): NOT DETECTED
GIARDIA LAMBLIA: NOT DETECTED
NOROVIRUS GI/GII: DETECTED — AB
Plesimonas shigelloides: NOT DETECTED
ROTAVIRUS A: NOT DETECTED
Salmonella species: NOT DETECTED
Sapovirus (I, II, IV, and V): NOT DETECTED
Shiga like toxin producing E coli (STEC): NOT DETECTED
Shigella/Enteroinvasive E coli (EIEC): NOT DETECTED
Vibrio cholerae: NOT DETECTED
Vibrio species: NOT DETECTED
Yersinia enterocolitica: NOT DETECTED

## 2017-12-16 ENCOUNTER — Other Ambulatory Visit: Payer: Self-pay

## 2017-12-16 ENCOUNTER — Emergency Department (HOSPITAL_COMMUNITY): Payer: 59

## 2017-12-16 ENCOUNTER — Encounter (HOSPITAL_COMMUNITY): Payer: Self-pay | Admitting: Emergency Medicine

## 2017-12-16 ENCOUNTER — Emergency Department (HOSPITAL_COMMUNITY)
Admission: EM | Admit: 2017-12-16 | Discharge: 2017-12-16 | Disposition: A | Discharge status: Home / Self Care | Payer: 59 | Attending: Emergency Medicine | Admitting: Emergency Medicine

## 2017-12-16 DIAGNOSIS — F17210 Nicotine dependence, cigarettes, uncomplicated: Secondary | ICD-10-CM | POA: Diagnosis not present

## 2017-12-16 DIAGNOSIS — J3489 Other specified disorders of nose and nasal sinuses: Secondary | ICD-10-CM | POA: Insufficient documentation

## 2017-12-16 DIAGNOSIS — M7918 Myalgia, other site: Secondary | ICD-10-CM | POA: Diagnosis not present

## 2017-12-16 DIAGNOSIS — Z79899 Other long term (current) drug therapy: Secondary | ICD-10-CM | POA: Insufficient documentation

## 2017-12-16 DIAGNOSIS — R509 Fever, unspecified: Secondary | ICD-10-CM | POA: Diagnosis not present

## 2017-12-16 DIAGNOSIS — R05 Cough: Secondary | ICD-10-CM | POA: Diagnosis present

## 2017-12-16 DIAGNOSIS — R0981 Nasal congestion: Secondary | ICD-10-CM | POA: Insufficient documentation

## 2017-12-16 DIAGNOSIS — J111 Influenza due to unidentified influenza virus with other respiratory manifestations: Secondary | ICD-10-CM

## 2017-12-16 DIAGNOSIS — R69 Illness, unspecified: Secondary | ICD-10-CM

## 2017-12-16 LAB — RAPID STREP SCREEN (MED CTR MEBANE ONLY): STREPTOCOCCUS, GROUP A SCREEN (DIRECT): NEGATIVE

## 2017-12-16 MED ORDER — BENZONATATE 100 MG PO CAPS: 100.0000 mg | ORAL_CAPSULE | Freq: Three times a day (TID) | ORAL | 0 refills | Status: DC

## 2017-12-16 MED ORDER — IBUPROFEN 600 MG PO TABS: 600.0000 mg | ORAL_TABLET | Freq: Four times a day (QID) | ORAL | 0 refills | Status: DC | PRN

## 2017-12-16 MED ORDER — FLUTICASONE PROPIONATE 50 MCG/ACT NA SUSP: 1.0000 | Freq: Every day | NASAL | 0 refills | Status: AC

## 2017-12-16 MED ORDER — CETIRIZINE HCL 10 MG PO TABS: 10.0000 mg | ORAL_TABLET | Freq: Every day | ORAL | 0 refills | Status: DC

## 2017-12-16 NOTE — ED Provider Notes (Signed)
[facility] EMERGENCY DEPARTMENT Provider Note   CSN: [id number] Arrival date & time: 12/16/17  1343     History   Chief Complaint Chief Complaint  Patient presents with  . Cough    HPI Bonnie Yang is a 32 y.o. female who presents to the emergency department today for flulike illness over the last 3 days.  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  She did not receive a flu vaccine this year.  She states that over the last 3 days she has been having ear fullness, nasal congestion, rhinorrhea, scratchy/dry throat, body aches, chills, subjective fevers.  She notes that last night she started having a productive cough with thick, yellow sputum.  She has been taking Advil Cold and Sinus, Tylenol, Mucinex, NyQuil and TheraFlu over the last 3 days with only short-term relief of her symptoms.  The patient denies any shortness of breath, dyspnea on exertion, chest pain, hemoptysis, sinus pressure, abdominal pain, nausea/vomiting/diarrhea.  HPI  Past Medical History:  Diagnosis Date  . Anemia   . GERD (gastroesophageal reflux disease)   . Hepatitis C antibody test positive   . IV drug abuse (HCC)   . Tobacco abuse   . UTI (urinary tract infection) 06/2016    Patient Active Problem List   Diagnosis Date Noted  . IV drug abuse (HCC)   . Hepatitis C antibody test positive   . Cellulitis of elbow 06/10/2016  . UTI (lower urinary tract infection) 06/10/2016  . Heroin use 06/10/2016  . Facial cellulitis 04/02/2015    Past Surgical History:  Procedure Laterality Date  . ABCESS DRAINAGE    . ABCESS DRAINAGE  04/2016   nose   . I&D EXTREMITY Right 06/10/2016   Procedure: IRRIGATION AND DEBRIDEMENT ARM;  Surgeon: Betha Loa, MD;  Location[facility] OR;  Service: Orthopedics;  Laterality: Right;    OB History    No data available       Home Medications    Prior to Admission medications   Medication Sig Start Date End Date Taking? Authorizing  Provider  b complex vitamins tablet Take 1 tablet by mouth daily.    [provider]  diphenoxylate-atropine (LOMOTIL) 2.5-0.025 MG tablet Take 1 tablet by mouth 4 (four) times daily as needed for diarrhea or loose stools. 12/05/17   Burgess Amor, PA-C  Ginkgo Biloba 40 MG TABS Take 1 tablet by mouth.    [provider]  Omega-3 Fatty Acids (FISH OIL PO) Take 1 capsule by mouth daily.    [provider]  ondansetron (ZOFRAN ODT) 4 MG disintegrating tablet Take 1 tablet (4 mg total) by mouth every 8 (eight) hours as needed for nausea or vomiting. 12/05/17   Burgess Amor, PA-C  Prenatal Vit-Fe Fumarate-FA (PRENATAL MULTIVITAMIN) TABS tablet Take 1 tablet by mouth daily at 12 noon.    [provider]    Family History Family History  Problem Relation Age of Onset  . Hypertension Mother   . Heart failure Mother     Social History Social History   Tobacco Use  . Smoking status: Current Every Day Smoker    Packs/day: 0.50    Years: 10.00    Pack years: 5.00    Types: Cigarettes  . Smokeless tobacco: Never Used  Substance Use Topics  . Alcohol use: Yes    Comment: 1I beer ocasionally  . Drug use: Yes    Types: Marijuana    Comment: occ,  last cocaine use 11/25/16     Allergies   Morphine and related   Review of Systems Review of Systems  All other systems reviewed and are negative.    Physical Exam Updated Vital Signs BP 115/62 (BP Location: Right Arm)   Pulse 97   Temp 98.1 F (36.7 C) (Oral)   Resp 18   Ht 5\' 5"  (1.651 m)   Wt 47.6 kg (105 lb)   LMP 12/04/2017   SpO2 100%   BMI 17.47 kg/m   Physical Exam  Constitutional: She appears well-developed and well-nourished.  HENT:  Head: Normocephalic and atraumatic.  Right Ear: Tympanic membrane, external ear and ear canal normal.  Left Ear: Tympanic membrane, external ear and ear canal normal.  Nose: Mucosal edema and rhinorrhea present. Right sinus exhibits no maxillary sinus  tenderness and no frontal sinus tenderness. Left sinus exhibits no maxillary sinus tenderness and no frontal sinus tenderness.  Mouth/Throat: Uvula is midline, oropharynx is clear and moist and mucous membranes are normal. No tonsillar exudate.  The patient has normal phonation and is in control of secretions. No stridor.  Midline uvula without edema. Soft palate rises symmetrically.  Patient is with mild tonsillar erythema without hypertrophy or exudates.No PTA. Tongue protrusion is normal. No trismus. No creptius on neck palpation and patient has good dentition. No gingival erythema or fluctuance noted. Mucus membranes moist.   Eyes: Pupils are equal, round, and reactive to light. Right eye exhibits no discharge. Left eye exhibits no discharge. No scleral icterus.  Neck: Trachea normal. Neck supple. No spinous process tenderness present. No neck rigidity. Normal range of motion present.  No nuchal rigidity or meningismus  Cardiovascular: Normal rate, regular rhythm and intact distal pulses.  No murmur heard. Pulses:      Radial pulses are 2+ on the right side, and 2+ on the left side.       Dorsalis pedis pulses are 2+ on the right side, and 2+ on the left side.       Posterior tibial pulses are 2+ on the right side, and 2+ on the left side.  No lower extremity swelling or edema. Calves symmetric in size bilaterally.  Pulmonary/Chest: Effort normal and breath sounds normal. She exhibits no tenderness.  No increased work of breathing. No accessory muscle use. Patient is sitting upright, speaking in full sentences without difficulty   Abdominal: Soft. Bowel sounds are normal. There is no tenderness. There is no rigidity, no rebound, no guarding and no CVA tenderness.  Musculoskeletal: She exhibits no edema.  Lymphadenopathy:    She has no cervical adenopathy.  Neurological: She is alert.  Skin: Skin is warm and dry. No petechiae, no purpura and no rash noted. She is not diaphoretic.    Psychiatric: She has a normal mood and affect.  Nursing note and vitals reviewed.    ED Treatments / Results  Labs (all labs ordered are listed, but only abnormal results are displayed) Labs Reviewed  RAPID STREP SCREEN (NOT AT [facility])  CULTURE, GROUP A STREP [facility])    EKG  EKG Interpretation None       Radiology Dg Chest 2 View  Result Date: 12/16/2017 CLINICAL DATA:  Productive cough and body aches with fever. EXAM: CHEST  2 VIEW COMPARISON:  Chest CT 09/04/2013 FINDINGS: The heart size and mediastinal contours are within normal limits. Both lungs are clear. The visualized skeletal structures are unremarkable. IMPRESSION: No active cardiopulmonary disease. Electronically Signed   By: Annitta Needs.D.  On: 12/16/2017 14:22    Procedures Procedures (including critical care time)  Medications Ordered in ED Medications - No data to display   Initial Impression / Assessment and Plan / ED Course  I have reviewed the triage vital signs and the nursing notes.  Pertinent labs & imaging results that were available during my care of the patient were reviewed by me and considered in my medical decision making (see chart for details).     32 y.o. female presenting with ear fullness, nasal congestion, rhinorrhea, scratchy/dry throat, body aches, chills, subjective fevers and a productive cough with yellow sputum over the last 3 days. Strep test and chest xray reassuring. Patient with symptoms consistent with influenza.  Vitals are stable.  No signs of dehydration, tolerating PO's.  Lungs are clear. Discussed the cost versus benefit of Tamiflu treatment with the patient.  The patient understands that symptoms are greater than the recommended 24-48 hour window of treatment.  Patient will be discharged with instructions to orally hydrate, rest, and use over-the-counter medications such as anti-inflammatories ibuprofen and Aleve for muscle aches and Tylenol for fever.  Patient will  also be given a cough suppressant.  Recommended Mucinex for nasal congestion.  Patient is to follow-up with PCP in 3 days.  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  Patient given strict return precautions.  She appears safe for discharge.  Final Clinical Impressions(s) / ED Diagnoses   Final diagnoses:  Influenza-like illness    ED Discharge Orders        Ordered    benzonatate (TESSALON) 100 MG capsule  Every 8 hours     12/16/17 1510    cetirizine (ZYRTEC) 10 MG tablet  Daily     12/16/17 1510    fluticasone (FLONASE) 50 MCG/ACT nasal spray  Daily     12/16/17 1510    ibuprofen (ADVIL,MOTRIN) 600 MG tablet  Every 6 hours PRN     12/16/17 1510       Princella PellegriniMaczis, Valborg Friar M, PA-C 12/16/17 1514    Bethann BerkshireZammit, Joseph, MD 12/16/17 1614

## 2017-12-16 NOTE — Discharge Instructions (Signed)
Please read and follow all provided instructions.  Your diagnoses today include:  1. Influenza-like illness     Tests performed today include: Vital signs. See below for your results today.  Chest xray  Strep test  Medications prescribed/advised:  1. Musinex [Guaifenesin] as a decongestant [thin mucus - you have to be well hydrated when taking this for it to work] - continue taking this over the counter.  2. Tylenol for fever/pain and Motrin/Ibuprofen for muscle aches 3. Flonase Steroid Nasal Spray. This does not work to maximum capability unless used daily >1-2 weeks.  4. Allegra or Zyrtec: This medication is an allergy medication that may aid in helping to relieve your symptoms. Please take daily and discuss with your PCP if you should remain on this medication at follow up. If you were prescribed a allergy medication (allegra) please take daily.  5. Cough Suppressant: Take as directed.    Home care instructions:  An upper respiratory infection (URI) is also sometimes known as the common cold. This is caused by a virus.  Possible that your symptoms are related to the flu however your outside of the treatment window for Tamiflu at this time so we will treat you symptomatically regardless.  Most people improve within 1 week, but symptoms can last up to 2 weeks. A residual cough may last even longer.   URI is most commonly caused by a virus. Viruses are NOT treated with antibiotics. You can easily spread the virus to others by oral contact. This includes kissing, sharing a glass, coughing, or sneezing. Touching your mouth or nose and then touching a surface, which is then touched by another person, can also spread the virus.   TREATMENT  Treatment is directed at relieving symptoms. There is no cure. Antibiotics are not effective, because the infection is caused by a virus, not by bacteria. Treatment may include:  Increased fluid intake. Sports drinks offer valuable electrolytes, sugars, and  fluids.  Breathing heated mist or steam (vaporizer or shower).  Eating chicken soup or other clear broths, and maintaining good nutrition.  Getting plenty of rest.  Using gargles or lozenges for comfort.  Controlling fevers with ibuprofen or acetaminophen as directed by your caregiver.  Increasing usage of your inhaler if you have asthma.  Return to work when your temperature has returned to normal.   Follow-up instructions: Followup with your primary care doctor in 4 days if your symptoms persist.  Your more than welcome to return to the emergency department if symptoms worsen or become concerning.  Return instructions:  Please return to the Emergency Department if you do not get better, if you get worse, or new symptoms OR  - Fever (temperature greater than 101.8F)  - Bleeding that does not stop with holding pressure to the area    -Severe pain (please note that you may be more sore the day after your accident)  - Chest Pain  - Difficulty breathing (worsening shortness of breath with sputum production may  be a sign of pneumonia.   - Severe nausea or vomiting  - Inability to tolerate food and liquids  - Passing out  - Skin becoming red around your wounds  - Change in mental status (confusion or lethargy)  - New numbness or weakness     -You develop fever, swollen neck glands, pain with swallowing or white areas on  the back of your throat. This may be a sign of strep throat.  Please return if you have any other emergent  concerns.  Additional Information:  Your vital signs today were: BP 115/62 (BP Location: Right Arm)    Pulse 97    Temp 98.1 F (36.7 C) (Oral)    Resp 18    Ht 5\' 5"  (1.651 m)    Wt 47.6 kg (105 lb)    LMP 12/04/2017    SpO2 100%    BMI 17.47 kg/m  If your blood pressure (BP) was elevated above 135/85 this visit, please have this repeated by your doctor within one month.

## 2017-12-16 NOTE — ED Triage Notes (Signed)
Patient c/o flu like symptoms that started last night-productive cough (thick, yellow sputum), sore throat, body aches, chills, ear fullness, and nasal drainage. Patient unsure of fevers. Patient states taking Advil cold and sinus, tylenol, Mucinex, Nightquil, and theraflu.

## 2017-12-19 LAB — CULTURE, GROUP A STREP (THRC)

## 2017-12-24 ENCOUNTER — Encounter: Payer: Self-pay | Admitting: Internal Medicine

## 2018-02-13 ENCOUNTER — Ambulatory Visit: Payer: 59 | Admitting: Gastroenterology

## 2018-02-13 ENCOUNTER — Encounter: Payer: Self-pay | Admitting: Gastroenterology

## 2018-02-13 ENCOUNTER — Telehealth: Payer: Self-pay | Admitting: Gastroenterology

## 2018-02-13 NOTE — Telephone Encounter (Signed)
Patient was a no show and letter sent  °

## 2018-03-06 ENCOUNTER — Ambulatory Visit: Payer: 59 | Admitting: Gastroenterology

## 2018-04-12 DIAGNOSIS — L089 Local infection of the skin and subcutaneous tissue, unspecified: Secondary | ICD-10-CM | POA: Diagnosis not present

## 2018-04-12 DIAGNOSIS — Z23 Encounter for immunization: Secondary | ICD-10-CM | POA: Diagnosis not present

## 2018-04-12 DIAGNOSIS — T8149XA Infection following a procedure, other surgical site, initial encounter: Secondary | ICD-10-CM | POA: Diagnosis not present

## 2018-04-12 DIAGNOSIS — S51852A Open bite of left forearm, initial encounter: Secondary | ICD-10-CM | POA: Diagnosis not present

## 2018-05-22 ENCOUNTER — Encounter: Payer: Self-pay | Admitting: *Deleted

## 2018-05-22 ENCOUNTER — Encounter: Payer: Self-pay | Admitting: Gastroenterology

## 2018-05-22 ENCOUNTER — Ambulatory Visit (INDEPENDENT_AMBULATORY_CARE_PROVIDER_SITE_OTHER): Payer: 59 | Admitting: Gastroenterology

## 2018-05-22 VITALS — BP 112/76 | HR 90 | Temp 97.1°F | Ht 65.0 in | Wt 108.8 lb

## 2018-05-22 DIAGNOSIS — B182 Chronic viral hepatitis C: Secondary | ICD-10-CM

## 2018-05-22 DIAGNOSIS — K219 Gastro-esophageal reflux disease without esophagitis: Secondary | ICD-10-CM | POA: Diagnosis not present

## 2018-05-22 MED ORDER — OMEPRAZOLE 20 MG PO CPDR: 20.0000 mg | DELAYED_RELEASE_CAPSULE | Freq: Every day | ORAL | 3 refills | Status: AC

## 2018-05-22 NOTE — Assessment & Plan Note (Signed)
32 year old delightful female with history of chronic Hep C, likely exposed in 2017 s/p IV drug use. Unfortunately, she had to undergo an I&D due to a right arm abscess s/p needle injection. She has abstained from drugs since that time, and her sister confirms this. She is highly motivated to improve her health and has an excellent support system with her sister, who goes to all visits with her.   Will update RNA and add genotype, as this is not known currently. Update Hep B serologies, assess immunity to Hep A. Check CMP, INR, CBC. Elastography in the near future. After review of this, will submit for Hep C treatment depending on genotype. We reviewed possible side effects, importance of compliance with follow-ups and medication daily, timing of lab work in the future, and no ETOH during treatment. She is only a social drinker and this should not be an issue. Further recommendations after labs and elastography.

## 2018-05-22 NOTE — Progress Notes (Signed)
CC'D TO PCP °

## 2018-05-22 NOTE — Assessment & Plan Note (Addendum)
Uncomplicated. Start Prilosec 20 mg daily, as this would be the best choice if PPI is absolutely needed during Hep C treatment. Ideally, we will take her off of this if possible during treatment.

## 2018-05-22 NOTE — Patient Instructions (Addendum)
Please have blood work done today.   I have ordered an ultrasound of your liver in preparation for treatment.  As soon as everything is reviewed, we can submit for the medication!  I have sent in Prilosec to your pharmacy to take once each morning, 30 minutes before breakfast. I have attached a handout on reflux as well.  I will be seeing you about 4 weeks into treatment.   It was a pleasure to see you today. I strive to create trusting relationships with patients to provide genuine, compassionate, and quality care. I value your feedback. If you receive a survey regarding your visit,  I greatly appreciate you taking time to fill this out.   Gelene MinkAnna W. Daphene Chisholm, PhD, ANP-BC [facility]    Food Choices for Gastroesophageal Reflux Disease, Adult When you have gastroesophageal reflux disease (GERD), the foods you eat and your eating habits are very important. Choosing the right foods can help ease the discomfort of GERD. Consider working with a diet and nutrition specialist (dietitian) to help you make healthy food choices. What general guidelines should I follow? Eating plan  Choose healthy foods low in fat, such as fruits, vegetables, whole grains, low-fat dairy products, and lean meat, fish, and poultry.  Eat frequent, small meals instead of three large meals each day. Eat your meals slowly, in a relaxed setting. Avoid bending over or lying down until 2-3 hours after eating.  Limit high-fat foods such as fatty meats or fried foods.  Limit your intake of oils, butter, and shortening to less than 8 teaspoons each day.  Avoid the following: ? Foods that cause symptoms. These may be different for different people. Keep a food diary to keep track of foods that cause symptoms. ? Alcohol. ? Drinking large amounts of liquid with meals. ? Eating meals during the 2-3 hours before bed.  Cook foods using methods other than frying. This may include baking, grilling, or  broiling. Lifestyle   Maintain a healthy weight. Ask your health care provider what weight is healthy for you. If you need to lose weight, work with your health care provider to do so safely.  Exercise for at least 30 minutes on 5 or more days each week, or as told by your health care provider.  Avoid wearing clothes that fit tightly around your waist and chest.  Do not use any products that contain nicotine or tobacco, such as cigarettes and e-cigarettes. If you need help quitting, ask your health care provider.  Sleep with the head of your bed raised. Use a wedge under the mattress or blocks under the bed frame to raise the head of the bed. What foods are not recommended? The items listed may not be a complete list. Talk with your dietitian about what dietary choices are best for you. Grains Pastries or quick breads with added fat. [place] toast. Vegetables Deep fried vegetables. [place] fries. Any vegetables prepared with added fat. Any vegetables that cause symptoms. For some people this may include tomatoes and tomato products, chili peppers, onions and garlic, and horseradish. Fruits Any fruits prepared with added fat. Any fruits that cause symptoms. For some people this may include citrus fruits, such as oranges, grapefruit, pineapple, and lemons. Meats and other protein foods High-fat meats, such as fatty beef or pork, hot dogs, ribs, ham, sausage, salami and bacon. Fried meat or protein, including fried fish and fried chicken. Nuts and nut butters. Dairy Whole milk and chocolate milk. Sour cream. Cream. Ice cream. Cream  cheese. Milk shakes. Beverages Coffee and tea, with or without caffeine. Carbonated beverages. Sodas. Energy drinks. Fruit juice made with acidic fruits (such as orange or grapefruit). Tomato juice. Alcoholic drinks. Fats and oils Butter. Margarine. Shortening. Ghee. Sweets and desserts Chocolate and cocoa. Donuts. Seasoning and other foods Pepper. Peppermint  and spearmint. Any condiments, herbs, or seasonings that cause symptoms. For some people, this may include curry, hot sauce, or vinegar-based salad dressings. Summary  When you have gastroesophageal reflux disease (GERD), food and lifestyle choices are very important to help ease the discomfort of GERD.  Eat frequent, small meals instead of three large meals each day. Eat your meals slowly, in a relaxed setting. Avoid bending over or lying down until 2-3 hours after eating.  Limit high-fat foods such as fatty meat or fried foods. This information is not intended to replace advice given to you by your health care provider. Make sure you discuss any questions you have with your health care provider. Document Released: 11/20/2005 Document Revised: 11/21/2016 Document Reviewed: 11/21/2016 Elsevier Interactive Patient Education  [employer].
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
## 2018-05-23 LAB — CBC WITH DIFFERENTIAL/PLATELET
BASOS ABS: 82 {cells}/uL (ref 0–200)
Basophils Relative: 1.7 %
EOS PCT: 3.7 %
Eosinophils Absolute: 178 cells/uL (ref 15–500)
HEMATOCRIT: 40.3 % (ref 35.0–45.0)
HEMOGLOBIN: 13.5 g/dL (ref 11.7–15.5)
LYMPHS ABS: 1656 {cells}/uL (ref 850–3900)
MCH: 28.8 pg (ref 27.0–33.0)
MCHC: 33.5 g/dL (ref 32.0–36.0)
MCV: 85.9 fL (ref 80.0–100.0)
MPV: 11.9 fL (ref 7.5–12.5)
Monocytes Relative: 11.9 %
NEUTROS ABS: 2314 {cells}/uL (ref 1500–7800)
NEUTROS PCT: 48.2 %
Platelets: 161 10*3/uL (ref 140–400)
RBC: 4.69 10*6/uL (ref 3.80–5.10)
RDW: 12 % (ref 11.0–15.0)
Total Lymphocyte: 34.5 %
WBC: 4.8 10*3/uL (ref 3.8–10.8)
WBCMIX: 571 {cells}/uL (ref 200–950)

## 2018-05-23 LAB — COMPLETE METABOLIC PANEL WITH GFR
AG RATIO: 1.4 (calc) (ref 1.0–2.5)
ALBUMIN MSPROF: 4.6 g/dL (ref 3.6–5.1)
ALT: 31 U/L — ABNORMAL HIGH (ref 6–29)
AST: 22 U/L (ref 10–30)
Alkaline phosphatase (APISO): 58 U/L (ref 33–115)
BUN: 17 mg/dL (ref 7–25)
CALCIUM: 9.9 mg/dL (ref 8.6–10.2)
CO2: 30 mmol/L (ref 20–32)
CREATININE: 0.83 mg/dL (ref 0.50–1.10)
Chloride: 106 mmol/L (ref 98–110)
GFR, EST AFRICAN AMERICAN: 107 mL/min/{1.73_m2} (ref >=60)
GFR, EST NON AFRICAN AMERICAN: 92 mL/min/{1.73_m2} (ref >=60)
GLOBULIN: 3.2 g/dL (ref 1.9–3.7)
Glucose, Bld: 89 mg/dL (ref 65–139)
POTASSIUM: 4.5 mmol/L (ref 3.5–5.3)
SODIUM: 145 mmol/L (ref 135–146)
Total Bilirubin: 0.3 mg/dL (ref 0.2–1.2)
Total Protein: 7.8 g/dL (ref 6.1–8.1)

## 2018-05-23 LAB — HIV ANTIBODY (ROUTINE TESTING W REFLEX): HIV 1&2 Ab, 4th Generation: NONREACTIVE

## 2018-05-23 LAB — HEPATITIS A ANTIBODY, TOTAL: Hepatitis A AB,Total: NONREACTIVE

## 2018-05-23 LAB — PROTIME-INR
INR: 0.9
Prothrombin Time: 10 s (ref 9.0–11.5)

## 2018-05-23 LAB — HEPATITIS B SURFACE ANTIGEN: Hepatitis B Surface Ag: NONREACTIVE

## 2018-05-23 LAB — HEPATITIS B CORE ANTIBODY, TOTAL: HEP B C TOTAL AB: NONREACTIVE

## 2018-05-23 LAB — HEPATITIS B SURFACE ANTIBODY,QUALITATIVE: HEP B S AB: REACTIVE — AB

## 2018-05-24 ENCOUNTER — Other Ambulatory Visit: Payer: Self-pay | Admitting: Gastroenterology

## 2018-05-24 DIAGNOSIS — R768 Other specified abnormal immunological findings in serum: Secondary | ICD-10-CM

## 2018-05-24 NOTE — Progress Notes (Signed)
ALT is only marginally elevated. INR normal. Immune to Hep B. Will need Hep A vaccination. For some reason, I inadvertently did not order the genotype for Hep C. I am so sorry: we need to have her complete Hep C RNA with reflex genotype. I have placed the orders, just need to be released. Once we get ultrasound back and genotype, we can submit for Hep C treatment.

## 2018-05-27 ENCOUNTER — Other Ambulatory Visit: Payer: Self-pay

## 2018-05-27 DIAGNOSIS — R768 Other specified abnormal immunological findings in serum: Secondary | ICD-10-CM

## 2018-05-28 ENCOUNTER — Other Ambulatory Visit: Payer: Self-pay

## 2018-05-29 ENCOUNTER — Ambulatory Visit (HOSPITAL_COMMUNITY)
Admission: RE | Admit: 2018-05-29 | Discharge: 2018-05-29 | Disposition: A | Discharge status: Home / Self Care | Payer: 59 | Source: Ambulatory Visit | Attending: Gastroenterology | Admitting: Gastroenterology

## 2018-05-29 DIAGNOSIS — B182 Chronic viral hepatitis C: Secondary | ICD-10-CM

## 2018-06-04 NOTE — Progress Notes (Signed)
Good news, little to no fibrosis on ultrasound. We just need her to complete the additional lab that I inadvertently did not order at last appt. It should be ordered, just needs to be released. Then we can submit!

## 2018-06-10 NOTE — Progress Notes (Signed)
Patient was aware of results and she is going to the lab to have labs drawn in the morning

## 2018-06-11 ENCOUNTER — Encounter (HOSPITAL_COMMUNITY): Payer: Self-pay | Admitting: Emergency Medicine

## 2018-06-11 ENCOUNTER — Other Ambulatory Visit: Payer: Self-pay

## 2018-06-11 ENCOUNTER — Emergency Department (HOSPITAL_COMMUNITY): Payer: No Typology Code available for payment source

## 2018-06-11 ENCOUNTER — Emergency Department (HOSPITAL_COMMUNITY)
Admission: EM | Admit: 2018-06-11 | Discharge: 2018-06-11 | Disposition: A | Discharge status: Home / Self Care | Payer: No Typology Code available for payment source | Attending: Emergency Medicine | Admitting: Emergency Medicine

## 2018-06-11 DIAGNOSIS — S199XXA Unspecified injury of neck, initial encounter: Secondary | ICD-10-CM | POA: Diagnosis not present

## 2018-06-11 DIAGNOSIS — M542 Cervicalgia: Secondary | ICD-10-CM | POA: Diagnosis not present

## 2018-06-11 DIAGNOSIS — F17210 Nicotine dependence, cigarettes, uncomplicated: Secondary | ICD-10-CM | POA: Diagnosis not present

## 2018-06-11 DIAGNOSIS — S161XXA Strain of muscle, fascia and tendon at neck level, initial encounter: Secondary | ICD-10-CM

## 2018-06-11 DIAGNOSIS — R5381 Other malaise: Secondary | ICD-10-CM | POA: Diagnosis not present

## 2018-06-11 DIAGNOSIS — Y9389 Activity, other specified: Secondary | ICD-10-CM | POA: Diagnosis not present

## 2018-06-11 DIAGNOSIS — Z79899 Other long term (current) drug therapy: Secondary | ICD-10-CM | POA: Diagnosis not present

## 2018-06-11 DIAGNOSIS — S022XXA Fracture of nasal bones, initial encounter for closed fracture: Secondary | ICD-10-CM | POA: Diagnosis not present

## 2018-06-11 DIAGNOSIS — Y92410 Unspecified street and highway as the place of occurrence of the external cause: Secondary | ICD-10-CM | POA: Diagnosis not present

## 2018-06-11 DIAGNOSIS — Y999 Unspecified external cause status: Secondary | ICD-10-CM | POA: Insufficient documentation

## 2018-06-11 DIAGNOSIS — S0993XA Unspecified injury of face, initial encounter: Secondary | ICD-10-CM | POA: Diagnosis present

## 2018-06-11 MED ORDER — HYDROCODONE-ACETAMINOPHEN 5-325 MG PO TABS: 1.0000 | ORAL_TABLET | ORAL | 0 refills | Status: DC | PRN

## 2018-06-11 MED ORDER — IBUPROFEN 400 MG PO TABS: 400.0000 mg | ORAL_TABLET | Freq: Four times a day (QID) | ORAL | 0 refills | Status: DC

## 2018-06-11 MED ORDER — ACETAMINOPHEN 325 MG PO TABS
650.0000 mg | ORAL_TABLET | Freq: Once | ORAL | Status: AC
  Administered 2018-06-11: 650 mg via ORAL
  Filled 2018-06-11: qty 2

## 2018-06-11 MED ORDER — CYCLOBENZAPRINE HCL 10 MG PO TABS
10.0000 mg | ORAL_TABLET | Freq: Once | ORAL | Status: AC
  Administered 2018-06-11: 10 mg via ORAL
  Filled 2018-06-11: qty 1

## 2018-06-11 MED ORDER — IBUPROFEN 400 MG PO TABS
400.0000 mg | ORAL_TABLET | Freq: Once | ORAL | Status: AC
  Administered 2018-06-11: 400 mg via ORAL
  Filled 2018-06-11: qty 1
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
## 2018-06-11 NOTE — Discharge Instructions (Signed)
Your blood pressure is elevated at 150/101, please have this rechecked by Dr. Margo AyeHall soon.  The CT scan of your neck shows evidence of mild arthritis and some spasm, but nothing broken or out of place.  The CT scan of your facial bones shows the left nasal bone to be broken.  Please apply ice pack tonight and tomorrow.  Please use ibuprofen every 6 hours.  Please use 6 hours Norco for more severe pain.Please see Dr Suszanne Connerseoh for evaluation evaluation broken nose.

## 2018-06-17 ENCOUNTER — Ambulatory Visit (INDEPENDENT_AMBULATORY_CARE_PROVIDER_SITE_OTHER): Payer: 59 | Admitting: Otolaryngology

## 2018-06-17 DIAGNOSIS — S022XXA Fracture of nasal bones, initial encounter for closed fracture: Secondary | ICD-10-CM

## 2018-06-17 DIAGNOSIS — J310 Chronic rhinitis: Secondary | ICD-10-CM

## 2018-06-17 NOTE — Progress Notes (Signed)
Just a reminder, still waiting on HCV RNA. I can't submit until we have this on file.

## 2018-06-17 NOTE — Progress Notes (Signed)
Pt is aware and will go either today or tomorrow for lab.

## 2018-06-19 ENCOUNTER — Other Ambulatory Visit: Payer: Self-pay | Admitting: Gastroenterology

## 2018-06-19 DIAGNOSIS — R768 Other specified abnormal immunological findings in serum: Secondary | ICD-10-CM | POA: Diagnosis not present

## 2018-06-21 NOTE — Progress Notes (Signed)
Can we make sure [facility] has this pending? I spoke on the phone with them on Wednesday I believe regarding this. It doesn't look like it's pending. I just dont' want her overlooked.

## 2018-06-24 LAB — HCV RNA,QN PCR RFLX GENO, LIPABAD
HCV RNA, PCR, QN (Log): 6.02 log IU/mL — ABNORMAL HIGH
HCV RNA, PCR, QN: 1050000 [IU]/mL — AB

## 2018-06-24 LAB — HEPATITIS C GENOTYPE: HCV Genotype: 2

## 2018-06-24 NOTE — Progress Notes (Signed)
Per Annice PihJackie at [facility] it is still pending.

## 2018-07-01 ENCOUNTER — Telehealth: Payer: Self-pay

## 2018-07-01 NOTE — Progress Notes (Signed)
FYI to Anna Boone, NP.  

## 2018-07-01 NOTE — Telephone Encounter (Signed)
Received fax of [facility] results and placed in box for review by Lewie LoronAnna Boone, NP.

## 2018-07-02 NOTE — Telephone Encounter (Signed)
They are in my result box now in epic.

## 2018-07-04 NOTE — Progress Notes (Signed)
Hep C genotype 2. She will need Epclusa for 12 weeks. We can go ahead and submit to insurance.

## 2018-07-05 ENCOUNTER — Telehealth: Payer: Self-pay | Admitting: Internal Medicine

## 2018-07-05 NOTE — Telephone Encounter (Signed)
Pt was returning a call to AM. Please call her back at [phone]

## 2018-07-05 NOTE — Telephone Encounter (Signed)
See other note

## 2018-07-10 IMAGING — US US ABDOMEN COMPLETE W/ ELASTOGRAPHY
2 series · 13 of 25 positions shown · non-contrast
Comparison: None.

CLINICAL DATA: Chronic hepatitis-C virus.



[Series 1: us abdomen complete w/ elastography · 0.16mm/px · 11 of 130 slices shown (1 of 2)]
[im 1/130]
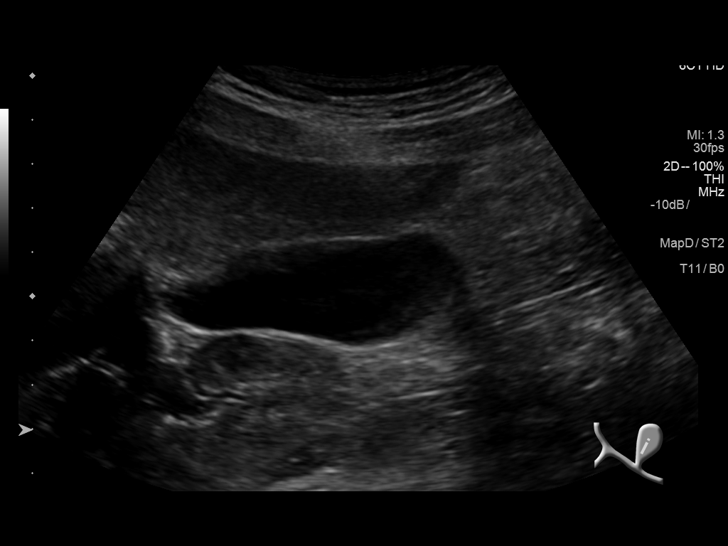
[im 13/130]
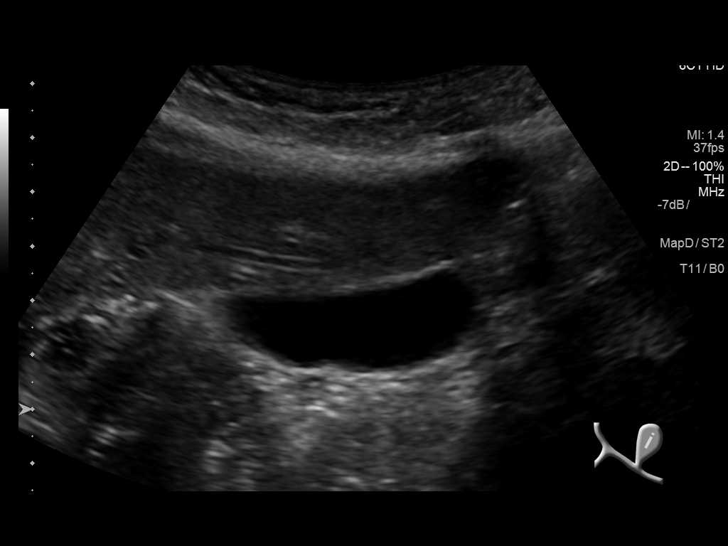
[im 25/130]
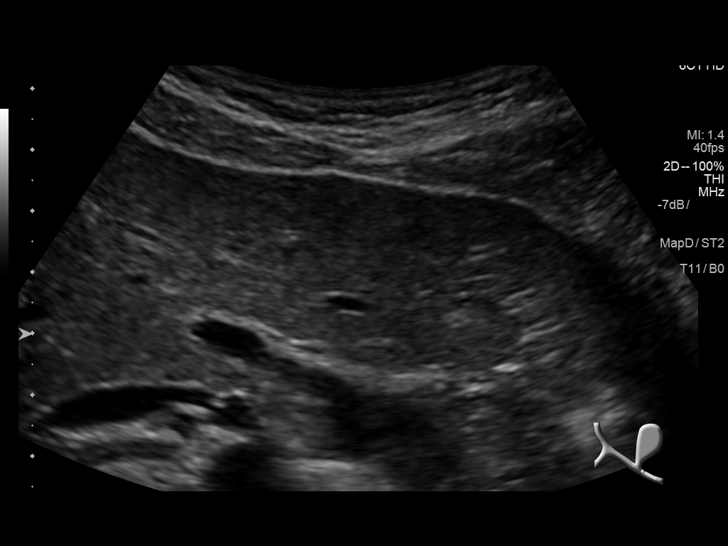
[im 37/130]
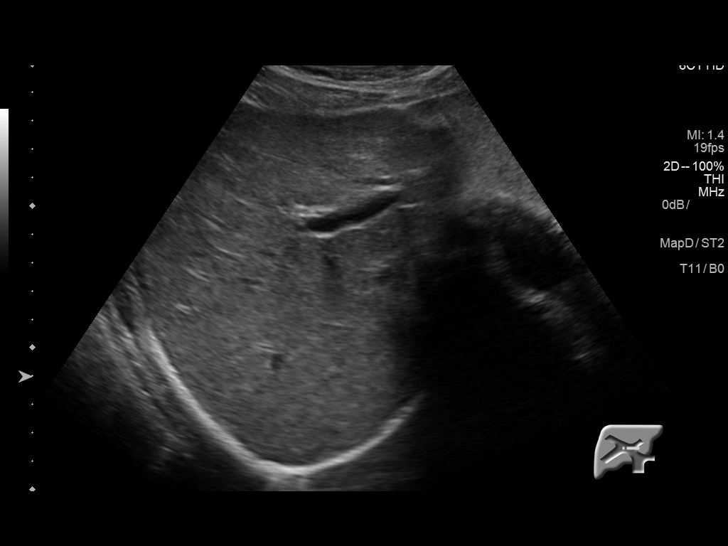
[im 50/130]
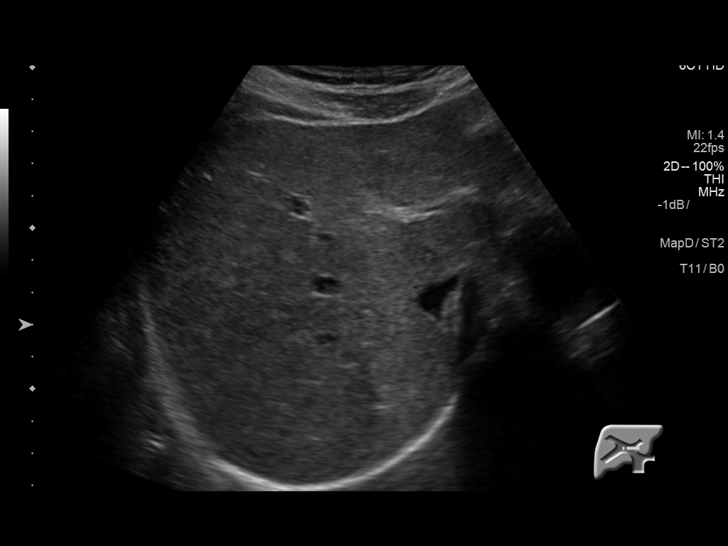
[im 62/130]
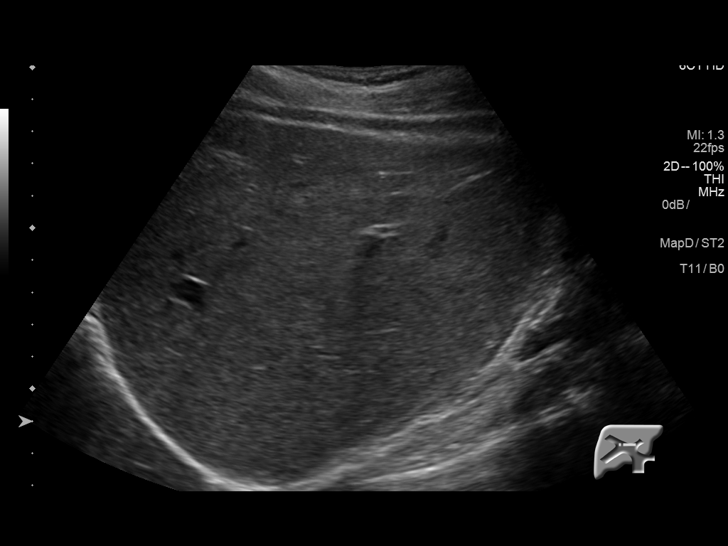
[im 74/130]
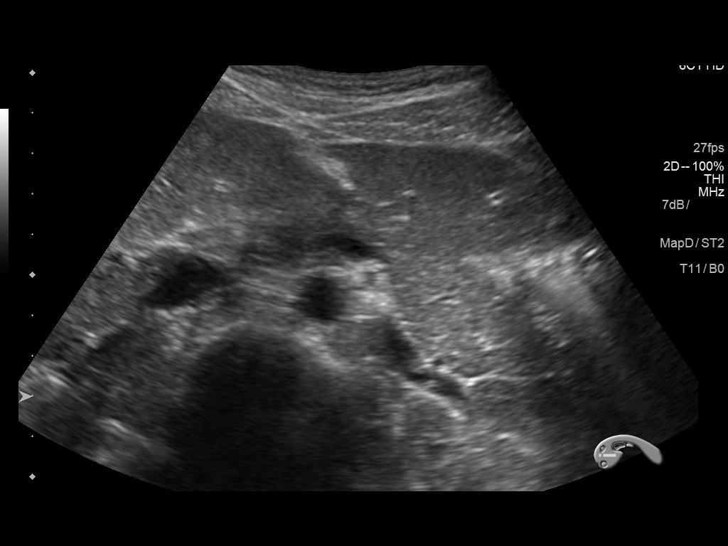
[im 87/130]
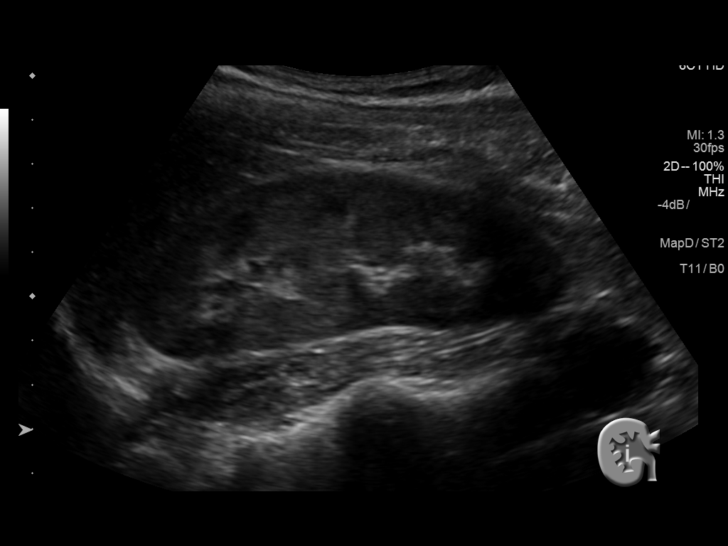
[im 99/130]
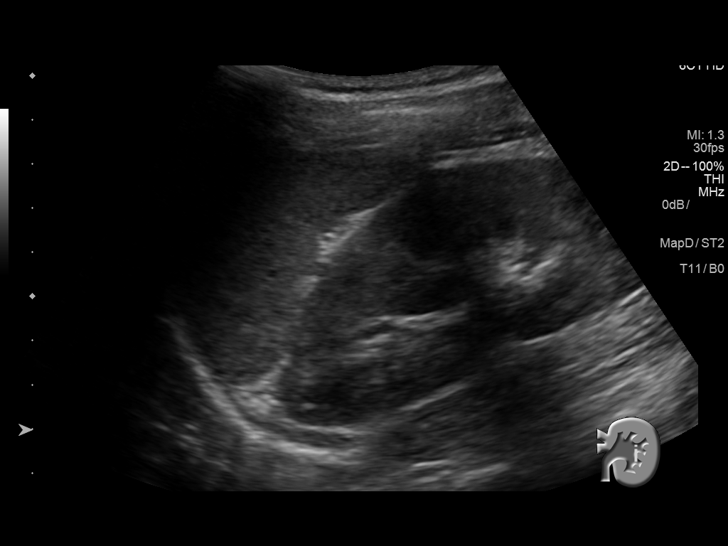
[im 111/130]
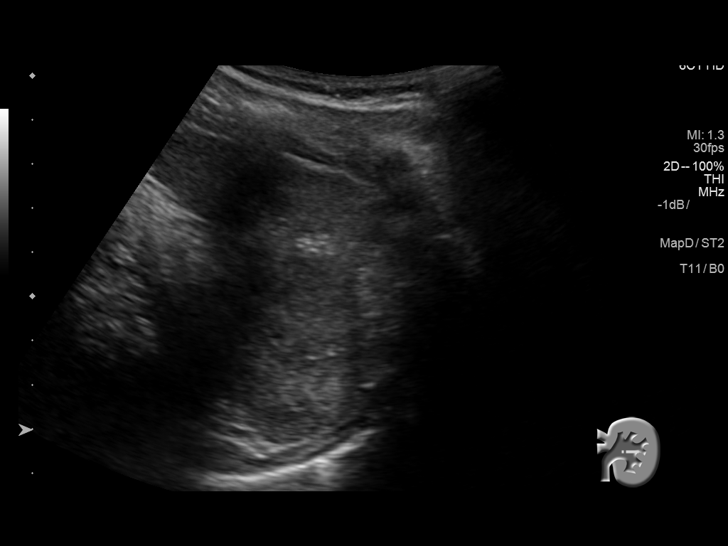
[im 123/130]
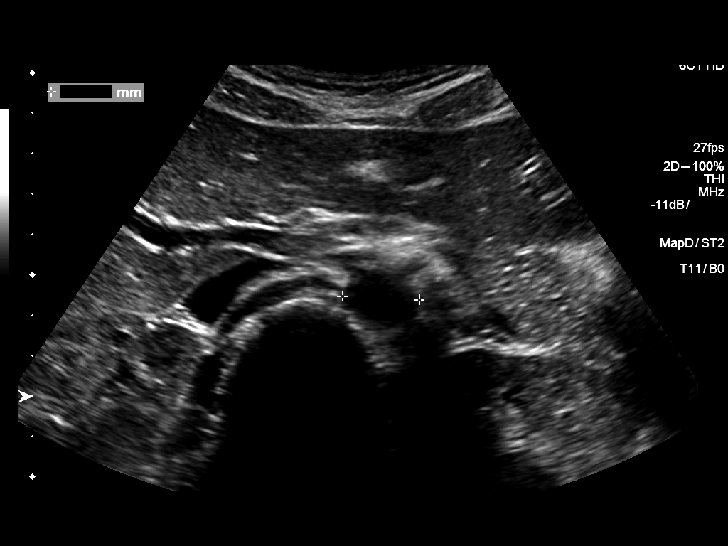

[Series 2001: us abdomen complete w/ elastography · 0.10mm/px · 2 of 20 slices shown (2 of 2)]
[im 1/20]
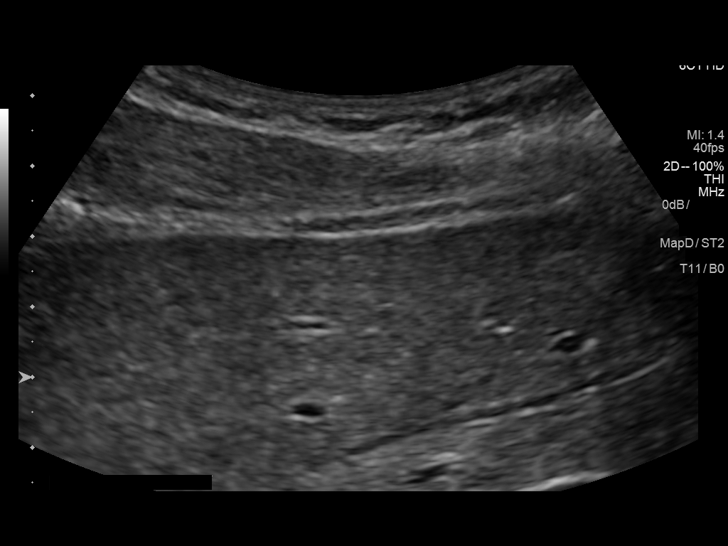
[im 20/20]
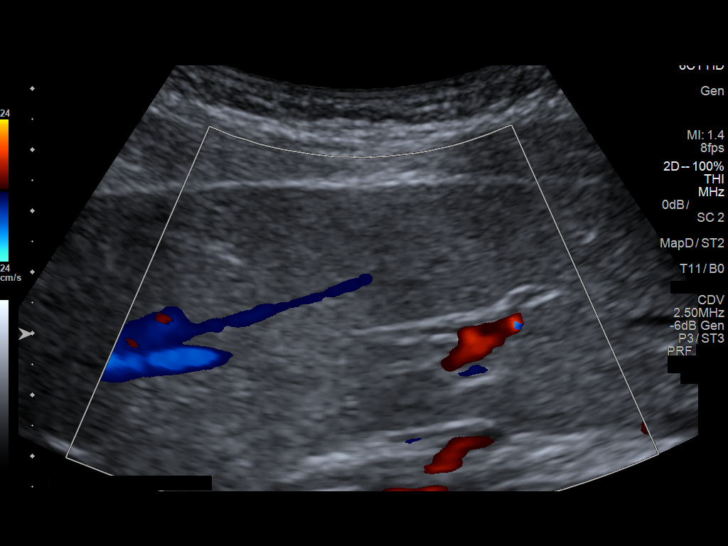

[13 of 25 positions shown; findings below may reference images not displayed]

FINDINGS: ULTRASOUND ABDOMEN

Gallbladder: No gallstones or wall thickening visualized. No
sonographic Murphy sign noted by sonographer.

Common bile duct: Diameter: Normal at 1 mm

Liver: No focal lesion identified. Within normal limits in
parenchymal echogenicity. Portal vein is patent on color Doppler
imaging with normal direction of blood flow towards the liver.

IVC: No abnormality visualized.

Pancreas: Visualized portion unremarkable.

Spleen: Size and appearance within normal limits.

Right Kidney: Length: 9.9 cm. Echogenicity within normal limits. No
mass or hydronephrosis visualized.

Left Kidney: Length: 10.0 cm. Echogenicity within normal limits. No
mass or hydronephrosis visualized.

Abdominal aorta: No aneurysm visualized.

Other findings: None.

ULTRASOUND HEPATIC ELASTOGRAPHY

Device: Siemens Helix VTQ

Patient position: Supine

Transducer 6C1

Number of measurements: 10

Hepatic segment:  8

Median velocity:   0.77 m/sec

IQR:

IQR/Median velocity ratio:

Corresponding Metavir fibrosis score:  F0/F1

Risk of fibrosis: Minimal

Limitations of exam: None

Please note that abnormal shear wave velocities may also be
identified in clinical settings other than with hepatic fibrosis,
such as: acute hepatitis, elevated right heart and central venous
pressures including use of beta blockers, Kaki disease
([place]), infiltrative processes such as
mastocytosis/amyloidosis/infiltrative tumor, extrahepatic
cholestasis, in the post-prandial state, and liver transplantation.
Correlation with patient history, laboratory data, and clinical
condition recommended.
IMPRESSION: ULTRASOUND ABDOMEN: No focal hepatic lesion.  Normal biliary tree.

ULTRASOUND HEPATIC ELASTOGRAPHY:

Median hepatic shear wave velocity is calculated at 0.77 m/sec.

Corresponding Metavir fibrosis score is F0/F1.

Risk of fibrosis is Minimal.

Follow-up: None required

## 2018-07-26 ENCOUNTER — Other Ambulatory Visit: Payer: Self-pay

## 2018-07-26 ENCOUNTER — Telehealth: Payer: Self-pay | Admitting: Gastroenterology

## 2018-07-26 ENCOUNTER — Telehealth: Payer: Self-pay | Admitting: Internal Medicine

## 2018-07-26 DIAGNOSIS — B182 Chronic viral hepatitis C: Secondary | ICD-10-CM

## 2018-07-26 NOTE — Telephone Encounter (Signed)
Slot is open

## 2018-07-26 NOTE — Telephone Encounter (Signed)
Noted. To clarify for documentation purposes:  Our office has requested multiple times that medication be sent to US and not the patient directly. Despite this, patient received medication directly and did not receive the normal letter and instructions that we give each patient prior to starting.   Please add her on September 6th at 11 am if she is able to come. We will need to create a spot. Prior to that appt that day, please have her complete: CBC, CMP, Hep C RNA, and INR. We will need this for insurance purposes.   I appreciate pharmacy assisting; however, per Epclusa recommendations in prescribing packet, needs to do this:  Take Epclusa WITH FOOD and must be FOUR HOURS before taking omeprazole. If she is able to discontinue omeprazole during this time, that would be the absolute best. If not, Epclusa needs to be same time each day with food and omeprazole needs to be 4 hours after that. We do not want her to take Omeprazole first before epclusa. Let me know if any further clarification needed.

## 2018-07-26 NOTE — Telephone Encounter (Signed)
Spoke with [facility] and they [facility]d rx [facility] to [facility]oke with [facility] [phone]. Order was created on 07/12/18, shipment was sent on 07/16/18 to pts house. . I was sent to the pharmacy and spoke with Manda. Manda asked my name, job title and pts name and DOB. Future orders are suppose to be sent to our office [facility] from now on.   [facility] pt [facility] Pharmacist told pt to take her Omeprazole at a different time of day so the medication don't interact with each other. Pt is currently taking Omeprazole in the morning at 6:00 AM and Epclusa at 11:00 AM. Other medications taken by pt are Fish oil, Multi Vitamin. Pt states these are the only medications she is currently taking.   Pt will need an appointment for September 11. No appointments are available.

## 2018-07-26 NOTE — Telephone Encounter (Signed)
Lmom, waiting on a return call.  

## 2018-07-26 NOTE — Telephone Encounter (Signed)
Spoke with pt. She is aware of how she should take her medication. Pt would like to take apt as directed for sep 6. Pt will have lab work prior to apt. Lab orders placed, released and mailed to pt

## 2018-07-26 NOTE — Telephone Encounter (Signed)
Pt called to say that she started her antibiotics on 07/18/2018

## 2018-07-26 NOTE — Telephone Encounter (Signed)
Please make September 6 at at 11:00 add on per AB. Pt is aware of appt.

## 2018-07-26 NOTE — Telephone Encounter (Signed)
I saw patient outside of the office today and she noted she was doing well on medication, which I"m glad to hear.   We need to touch base and find out exact date this was started. She needs to be seen in 4 weeks from first starting and needs CBC, HFP, Hep C RNA, INR at 4 weeks.   We also need to review again what medications she is on. Med list includes Prilosec, which should NOT be taken with Prilosec. If possible, do not take Prilosec at all. IF she has to take it, she should only take it 4 hours after Epclusa dose in the morning. No other OTC acid reducing agents (like tums, pepcid, etc). NO alcohol while on Hep C treatment.

## 2018-07-26 NOTE — Telephone Encounter (Signed)
Patient needs the September appointment added

## 2018-07-26 NOTE — Telephone Encounter (Signed)
Will you please open that time slot.

## 2018-07-29 ENCOUNTER — Encounter: Payer: Self-pay | Admitting: Internal Medicine

## 2018-07-29 NOTE — Telephone Encounter (Signed)
Noted. See other phone note for documentation.

## 2018-08-07 ENCOUNTER — Telehealth: Payer: Self-pay

## 2018-08-07 NOTE — Telephone Encounter (Signed)
Received a call from Yell Rep from [facility], which called to schedule delivery of Epclusa. Delivery is scheduled for tomorrow morning.   Pt called after I spoke with the rep Yell and said she would be by to pick her medication up tomorrow. Pt is aware that our office will call her when it arrives.

## 2018-08-08 ENCOUNTER — Telehealth: Payer: Self-pay

## 2018-08-08 NOTE — Telephone Encounter (Signed)
Letter of instructions/medication list very signed by pt in office today.

## 2018-08-08 NOTE — Telephone Encounter (Signed)
Medications were update today. Pt walked in office to pick up Epclusa medication. Pt will be starting her second month of medication. Pt is only taking Epclusa, Multi vitamin and has Omeprazole on hand if needed. Pt hasn't had to take Omeprazole since she started [place].

## 2018-08-09 ENCOUNTER — Ambulatory Visit: Payer: 59 | Admitting: Gastroenterology

## 2018-08-12 ENCOUNTER — Encounter: Payer: Self-pay | Admitting: Gastroenterology

## 2018-08-12 ENCOUNTER — Ambulatory Visit (INDEPENDENT_AMBULATORY_CARE_PROVIDER_SITE_OTHER): Payer: 59 | Admitting: Gastroenterology

## 2018-08-12 ENCOUNTER — Encounter: Payer: Self-pay | Admitting: Internal Medicine

## 2018-08-12 VITALS — BP 115/79 | HR 83 | Temp 97.0°F | Ht 65.0 in | Wt 112.4 lb

## 2018-08-12 DIAGNOSIS — B182 Chronic viral hepatitis C: Secondary | ICD-10-CM | POA: Diagnosis not present

## 2018-08-12 LAB — COMPLETE METABOLIC PANEL WITH GFR
AG Ratio: 1.5 (calc) (ref 1.0–2.5)
ALBUMIN MSPROF: 4.5 g/dL (ref 3.6–5.1)
ALKALINE PHOSPHATASE (APISO): 48 U/L (ref 33–115)
ALT: 9 U/L (ref 6–29)
AST: 14 U/L (ref 10–30)
BUN: 12 mg/dL (ref 7–25)
CO2: 25 mmol/L (ref 20–32)
CREATININE: 0.87 mg/dL (ref 0.50–1.10)
Calcium: 9.5 mg/dL (ref 8.6–10.2)
Chloride: 105 mmol/L (ref 98–110)
GFR, EST AFRICAN AMERICAN: 101 mL/min/{1.73_m2} (ref >=60)
GFR, EST NON AFRICAN AMERICAN: 87 mL/min/{1.73_m2} (ref >=60)
GLOBULIN: 3.1 g/dL (ref 1.9–3.7)
Glucose, Bld: 93 mg/dL (ref 65–99)
Potassium: 4.9 mmol/L (ref 3.5–5.3)
SODIUM: 137 mmol/L (ref 135–146)
TOTAL PROTEIN: 7.6 g/dL (ref 6.1–8.1)
Total Bilirubin: 0.3 mg/dL (ref 0.2–1.2)

## 2018-08-12 LAB — CBC WITH DIFFERENTIAL/PLATELET
BASOS ABS: 59 {cells}/uL (ref 0–200)
Basophils Relative: 1.2 %
EOS PCT: 2.8 %
Eosinophils Absolute: 137 cells/uL (ref 15–500)
HEMATOCRIT: 39 % (ref 35.0–45.0)
HEMOGLOBIN: 12.9 g/dL (ref 11.7–15.5)
Lymphs Abs: 1906 cells/uL (ref 850–3900)
MCH: 28.4 pg (ref 27.0–33.0)
MCHC: 33.1 g/dL (ref 32.0–36.0)
MCV: 85.7 fL (ref 80.0–100.0)
MONOS PCT: 14.2 %
MPV: 11.2 fL (ref 7.5–12.5)
NEUTROS ABS: 2102 {cells}/uL (ref 1500–7800)
Neutrophils Relative %: 42.9 %
Platelets: 205 10*3/uL (ref 140–400)
RBC: 4.55 10*6/uL (ref 3.80–5.10)
RDW: 12.4 % (ref 11.0–15.0)
Total Lymphocyte: 38.9 %
WBC: 4.9 10*3/uL (ref 3.8–10.8)
WBCMIX: 696 {cells}/uL (ref 200–950)

## 2018-08-12 LAB — PROTIME-INR
INR: 0.9
Prothrombin Time: 10 s (ref 9.0–11.5)

## 2018-08-12 LAB — HEPATITIS C RNA QUANTITATIVE
HCV QUANT LOG: NOT DETECTED {Log_IU}/mL
HCV RNA, PCR, QN: NOT DETECTED [IU]/mL

## 2018-08-12 NOTE — Progress Notes (Signed)
Primary Care Physician: Benita Stabile, MD  Primary Gastroenterologist:  Roetta Sessions, MD   Chief Complaint  Patient presents with  . Hepatitis C    taking Epclusa    HPI: Bonnie Yang is a 32 y.o. female here for follow-up of hepatitis C, genotype 2, treatment nave without cirrhosis.  Plans for Epclusa for 12 weeks.  He just completed 4 weeks of therapy.  Her LFTs have normalized. Her HCV RNA is pending.  Her history of IV drug use, none since 2017.  Patient has been compliant with her medication, takes it at the same time every day.  About 3 weeks into treatment she noticed a little bit of itching and few bumps on her back.  She talked to the pharmacist twice who assured her that this is a potential side effect and she should just monitor and utilize low-dose Benadryl.  She states she really has not noticed much itching since then.  No rash.  Seems to be tolerating the medication.  She denies omeprazole in the past week.  She does note that the pharmacist told her to take the omeprazole 4 hours prior to the [place].  She did this a couple of times.  She was given the correct information from our office when they were notified that she was taken medication and now if she takes that she takes Epclusa followed by omeprazole 4 hours later.    She denies abdominal pain, jaundice, bowel concerns, appetite concerns, vomiting.   Current Outpatient Medications  Medication Sig Dispense Refill  . diphenhydrAMINE (BENADRYL) 25 MG tablet Take 25 mg by mouth every 6 (six) hours as needed. Sometimes takes 1/2 tablet    . fluticasone (FLONASE) 50 MCG/ACT nasal spray Place 1 spray into both nostrils daily. 16 g 0  . ibuprofen (ADVIL,MOTRIN) 400 MG tablet Take 1 tablet (400 mg total) by mouth 4 (four) times daily. 30 tablet 0  . Multiple Vitamins-Minerals (HAIR VITAMINS PO) Take by mouth daily.    . Omega-3 Fatty Acids (FISH OIL PO) Take 1 capsule by mouth daily.    Marland Kitchen omeprazole (PRILOSEC) 20 MG  capsule Take 1 capsule (20 mg total) by mouth daily. 30 minutes before breakfast 30 capsule 3  . EPCLUSA 400-100 MG TABS Take 1 tablet by mouth daily.     No current facility-administered medications for this visit.     Allergies as of 08/12/2018 - Review Complete 08/12/2018  Allergen Reaction Noted  . Morphine and related Hives 11/30/2015    ROS:  General: Negative for anorexia, weight loss, fever, chills, fatigue, weakness. ENT: Negative for hoarseness, difficulty swallowing , nasal congestion. CV: Negative for chest pain, angina, palpitations, dyspnea on exertion, peripheral edema.  Respiratory: Negative for dyspnea at rest, dyspnea on exertion, cough, sputum, wheezing.  GI: See history of present illness. GU:  Negative for dysuria, hematuria, urinary incontinence, urinary frequency, nocturnal urination.  Endo: Negative for unusual weight change.    Physical Examination:   BP 115/79   Pulse 83   Temp (!) 97 F (36.1 C) (Oral)   Ht 5\' 5"  (1.651 m)   Wt 112 lb 6.4 oz (51 kg)   LMP 07/18/2018 (Approximate)   BMI 18.70 kg/m   General: Well-nourished, well-developed in no acute distress.  Eyes: No icterus. Mouth: Oropharyngeal mucosa moist and pink , no lesions erythema or exudate. Lungs: Clear to auscultation bilaterally.  Heart: Regular rate and rhythm, no murmurs rubs or gallops.  Abdomen: Bowel sounds  are normal, nontender, nondistended, no hepatosplenomegaly or masses, no abdominal bruits or hernia , no rebound or guarding.   Extremities: No lower extremity edema. No clubbing or deformities. Neuro: Alert and oriented x 4   Skin: Warm and dry, no jaundice.   Psych: Alert and cooperative, normal mood and affect.    Lab Results  Component Value Date   CREATININE 0.87 08/09/2018   BUN 12 08/09/2018   NA 137 08/09/2018   K 4.9 08/09/2018   CL 105 08/09/2018   CO2 25 08/09/2018   This SmartLink has not been configured with any valid records.   Lab Results    Component Value Date   ALT 9 08/09/2018   AST 14 08/09/2018   ALKPHOS 48 12/05/2017   BILITOT 0.3 08/09/2018   Lab Results  Component Value Date   WBC 4.9 08/09/2018   HGB 12.9 08/09/2018   HCT 39.0 08/09/2018   MCV 85.7 08/09/2018   PLT 205 08/09/2018

## 2018-08-12 NOTE — Patient Instructions (Addendum)
Continue taking Epclusa every day! You are doing a great job!  If you notice itching that becomes regular, requiring benadryl more than 3 times per week, please let us know. If you have rash, let us know. If you have difficulty breathing or swelling go to ER.   We will call you with your hepatitis C results as available.   Return office visit in 8 weeks.   For acid reflux, it's best not to take omeprazole but if you need it then take omeprazole four hours after you take the Epclusa.   You could also try an over-the-counter medication to use as needed for acid reflux instead of the omeprazole. 1. TUMS or Rolaids separated at least four hours from [place].  2. Pepcid (famotidine) 40mg  up to twice per day administered with Epclusa or 12 hours apart from [place].

## 2018-08-13 NOTE — Progress Notes (Signed)
CBC, INR, CMP, all normal. Hep C RNA undetectable, which is great! STILL KEEP TAKING EPCLUSA. We will be checking this again at her next visit and then 3 months post-treatment. FYI to [place] who saw her in office yesterday.

## 2018-08-13 NOTE — Progress Notes (Signed)
Pt is aware.  

## 2018-08-16 NOTE — Assessment & Plan Note (Signed)
Chronic hepatitis C, genotype 2, previously treatment nave without cirrhosis who presents for follow-up.  She has completed 4 weeks of Epclusa, total of 12 weeks planned.  Overall doing well.  She had some vague itching and possible mild rash on her back over a week ago, this is now resolved with limited Benadryl.  Of course she was told to continue to monitor for any further symptoms and of potential alarm symptoms.  Her HCVRNA is pending.  We will call with results when available.  She will come back in 8 weeks for follow-up.  ReIterated need for compliance with medication as well as avoidance of IV/intranasal drug use. Reminded her how to use antacids, omeprazole IF NEEDED. She will call with any questions or concerns.

## 2018-08-19 NOTE — Progress Notes (Signed)
cc'd to pcp 

## 2018-08-22 ENCOUNTER — Ambulatory Visit (INDEPENDENT_AMBULATORY_CARE_PROVIDER_SITE_OTHER): Payer: Self-pay | Admitting: Otolaryngology

## 2018-08-29 ENCOUNTER — Telehealth: Payer: Self-pay

## 2018-08-29 NOTE — Telephone Encounter (Signed)
Received a call from [place] from [employer]. Pts medication is scheduled for delivery Tuesday Sep 03, 2018.

## 2018-09-03 NOTE — Telephone Encounter (Signed)
Noted  

## 2018-09-03 NOTE — Telephone Encounter (Signed)
Hep C medication arrived today. Left message form Pt. Pt is aware medication is ready for pickup.

## 2018-10-28 ENCOUNTER — Ambulatory Visit: Payer: 59 | Admitting: Gastroenterology

## 2018-10-28 ENCOUNTER — Encounter: Payer: Self-pay | Admitting: Internal Medicine

## 2018-10-28 ENCOUNTER — Telehealth: Payer: Self-pay | Admitting: Gastroenterology

## 2018-10-28 NOTE — Telephone Encounter (Signed)
PATIENT WAS A NO SHOW AND LETTER SENT  °

## 2018-10-28 NOTE — Telephone Encounter (Signed)
Has she completed 12 weeks of Epclusa?  Please contact patient to see how she is and if she has completed 12 weeks of Epclusa.   She needs to reschedule OV.   She needs HCV RNA quantitative now.

## 2018-10-29 ENCOUNTER — Other Ambulatory Visit: Payer: Self-pay

## 2018-10-29 ENCOUNTER — Telehealth: Payer: Self-pay | Admitting: Internal Medicine

## 2018-10-29 ENCOUNTER — Encounter (HOSPITAL_COMMUNITY): Payer: Self-pay

## 2018-10-29 ENCOUNTER — Emergency Department (HOSPITAL_COMMUNITY)
Admission: EM | Admit: 2018-10-29 | Discharge: 2018-10-29 | Disposition: A | Discharge status: Home / Self Care | Payer: 59 | Attending: Emergency Medicine | Admitting: Emergency Medicine

## 2018-10-29 DIAGNOSIS — F17210 Nicotine dependence, cigarettes, uncomplicated: Secondary | ICD-10-CM | POA: Insufficient documentation

## 2018-10-29 DIAGNOSIS — Z79899 Other long term (current) drug therapy: Secondary | ICD-10-CM | POA: Diagnosis not present

## 2018-10-29 DIAGNOSIS — Z8619 Personal history of other infectious and parasitic diseases: Secondary | ICD-10-CM

## 2018-10-29 DIAGNOSIS — R51 Headache: Secondary | ICD-10-CM | POA: Diagnosis present

## 2018-10-29 DIAGNOSIS — J320 Chronic maxillary sinusitis: Secondary | ICD-10-CM | POA: Diagnosis not present

## 2018-10-29 MED ORDER — AMOXICILLIN 500 MG PO CAPS: 500.0000 mg | ORAL_CAPSULE | Freq: Three times a day (TID) | ORAL | 0 refills | Status: AC

## 2018-10-29 MED ORDER — AMOXICILLIN-POT CLAVULANATE 500-125 MG PO TABS: 1.0000 | ORAL_TABLET | Freq: Two times a day (BID) | ORAL | 0 refills | Status: AC

## 2018-10-29 MED ORDER — FLUTICASONE PROPIONATE 50 MCG/ACT NA SUSP: 2.0000 | Freq: Every day | NASAL | 2 refills | Status: AC

## 2018-10-29 NOTE — Telephone Encounter (Signed)
Noted  

## 2018-10-29 NOTE — ED Triage Notes (Signed)
Pt reports severe sinus congestion for the past month, worse the past 2 days.  Pt says has been unable to rest.  Pt also reports has acid reflux and has been having a lot of "gas build up."   Denies n/v.

## 2018-10-29 NOTE — Telephone Encounter (Signed)
Lmom, waiting on a return call.  

## 2018-10-29 NOTE — Telephone Encounter (Signed)
Pt called to say she overslept yesterday and missed her OV with us. She is rescheduled and is aware of date and time. I told her AM was on another call but needed to speak with her also. Please call patient at [phone]

## 2018-10-29 NOTE — Telephone Encounter (Signed)
Noted. Pt overslept due to being sick. Pt completed medication around 10/08/18.  She is r/s for 11/06/18. Lab orders placed and she will have blood work done this week.

## 2018-10-29 NOTE — Telephone Encounter (Signed)
Noted. See over phone note for documentation.

## 2018-10-29 NOTE — ED Provider Notes (Signed)
[facility] EMERGENCY DEPARTMENT Provider Note   CSN: [id number] Arrival date & time: 10/29/18  0709     History   Chief Complaint Chief Complaint  Patient presents with  . Facial Pain    HPI Bonnie Yang is a 32 y.o. female.  Persistent left maxillary sinus pain for 1 month.  Patient has been seen by an otolaryngologist and prescribed Flonase.  Symptoms have worsened in the past 2 days.  No fever, chills, meningeal signs.  Slight bloody discharge.  Severity of symptoms is moderate.  Palpation makes pain worse.     Past Medical History:  Diagnosis Date  . Anemia   . GERD (gastroesophageal reflux disease)   . Hepatitis C antibody test positive   . IV drug abuse (HCC)   . Tobacco abuse   . UTI (urinary tract infection) 06/2016    Patient Active Problem List   Diagnosis Date Noted  . Chronic hepatitis C without hepatic coma (HCC) 05/22/2018  . GERD (gastroesophageal reflux disease) 05/22/2018  . IV drug abuse (HCC)   . Hepatitis C antibody test positive   . Cellulitis of elbow 06/10/2016  . UTI (lower urinary tract infection) 06/10/2016  . Heroin use 06/10/2016  . Facial cellulitis 04/02/2015    Past Surgical History:  Procedure Laterality Date  . ABCESS DRAINAGE    . ABCESS DRAINAGE  04/2016   nose   . I&D EXTREMITY Right 06/10/2016   Procedure: IRRIGATION AND DEBRIDEMENT ARM;  Surgeon: Betha LoaKevin Kuzma, MD;  [facility];  Service: Orthopedics;  Laterality: Right;     OB History   None      Home Medications    Prior to Admission medications   Medication Sig Start Date End Date Taking? Authorizing Provider  amoxicillin (AMOXIL) 500 MG capsule Take 1 capsule (500 mg total) by mouth 3 (three) times daily. 10/29/18   Donnetta Hutchingook, Hussam Muniz, MD  amoxicillin-clavulanate (AUGMENTIN) 500-125 MG tablet Take 1 tablet (500 mg total) by mouth 2 (two) times daily. 10/29/18   Donnetta Hutchingook, Sakia Schrimpf, MD  diphenhydrAMINE (BENADRYL) 25 MG tablet Take 25 mg by mouth every 6 (six) hours as  needed. Sometimes takes 1/2 tablet    [provider]  EPCLUSA 400-100 MG TABS Take 1 tablet by mouth daily. 08/07/18   [provider]  fluticasone (FLONASE) 50 MCG/ACT nasal spray Place 1 spray into both nostrils daily. 12/16/17   Maczis, Elmer SowMichael M, PA-C  fluticasone (FLONASE) 50 MCG/ACT nasal spray Place 2 sprays into both nostrils daily. 10/29/18   Donnetta Hutchingook, Elverta Dimiceli, MD  ibuprofen (ADVIL,MOTRIN) 400 MG tablet Take 1 tablet (400 mg total) by mouth 4 (four) times daily. 06/11/18   Ivery QualeBryant, [place], PA-C  Multiple Vitamins-Minerals (HAIR VITAMINS PO) Take by mouth daily.    [provider]  Omega-3 Fatty Acids (FISH OIL PO) Take 1 capsule by mouth daily.    [provider]  omeprazole (PRILOSEC) 20 MG capsule Take 1 capsule (20 mg total) by mouth daily. 30 minutes before breakfast 05/22/18   Gelene MinkBoone, Anna W, NP    Family History Family History  Problem Relation Age of Onset  . Hypertension Mother   . Heart failure Mother   . Colon cancer Neg Hx   . Colon polyps Neg Hx     Social History Social History   Tobacco Use  . Smoking status: Current Every Day Smoker    Packs/day: 0.25    Years: 10.00    Pack years: 2.50    Types: Cigarettes  .  Smokeless tobacco: Never Used  . Tobacco comment: usually 2-3 cigarettes a day  Substance Use Topics  . Alcohol use: Not Currently    Alcohol/week: 7.0 standard drinks    Types: 7 Cans of beer per week    Comment: denied 08/12/18  . Drug use: Not Currently    Types: Marijuana    Comment: denied 08/12/18     Allergies   Morphine and related   Review of Systems Review of Systems  All other systems reviewed and are negative.    Physical Exam Updated Vital Signs BP (!) 123/91 (BP Location: Right Arm)   Pulse 100   Temp 98.2 F (36.8 C) (Oral)   Resp 18   Ht 5\' 5"  (1.651 m)   Wt 49.9 kg   LMP 09/14/2018 (Approximate)   SpO2 100%   BMI 18.30 kg/m   Physical Exam  Constitutional: She is oriented to person,  place, and time. She appears well-developed and well-nourished.  HENT:  Head: Normocephalic and atraumatic.  Tenderness over left maxillary sinus  Eyes: Conjunctivae are normal.  Neck: Neck supple.  Cardiovascular: Normal rate and regular rhythm.  Pulmonary/Chest: Effort normal and breath sounds normal.  Abdominal: Soft. Bowel sounds are normal.  Musculoskeletal: Normal range of motion.  Neurological: She is alert and oriented to person, place, and time.  Skin: Skin is warm and dry.  Psychiatric: She has a normal mood and affect. Her behavior is normal.  Nursing note and vitals reviewed.    ED Treatments / Results  Labs (all labs ordered are listed, but only abnormal results are displayed) Labs Reviewed - No data to display  EKG None  Radiology No results found.  Procedures Procedures (including critical care time)  Medications Ordered in ED Medications - No data to display   Initial Impression / Assessment and Plan / ED Course  I have reviewed the triage vital signs and the nursing notes.  Pertinent labs & imaging results that were available during my care of the patient were reviewed by me and considered in my medical decision making (see chart for details).     History and physical most consistent with left maxillary sinusitis.  Rx Augmentin 500/125, Flonase, follow-up with ENT  Final Clinical Impressions(s) / ED Diagnoses   Final diagnoses:  Left maxillary sinusitis    ED Discharge Orders         Ordered    amoxicillin-clavulanate (AUGMENTIN) 500-125 MG tablet  2 times daily     10/29/18 0746    fluticasone (FLONASE) 50 MCG/ACT nasal spray  Daily     10/29/18 0748    amoxicillin (AMOXIL) 500 MG capsule  3 times daily     10/29/18 0758           Donnetta Hutching, MD 10/29/18 [id number]

## 2018-10-29 NOTE — Discharge Instructions (Signed)
Prescription for antibiotic and Flonase.  Follow-up with your ENT doctor

## 2018-11-06 ENCOUNTER — Ambulatory Visit: Payer: 59 | Admitting: Nurse Practitioner

## 2018-11-06 ENCOUNTER — Telehealth: Payer: Self-pay | Admitting: Internal Medicine

## 2018-11-06 NOTE — Progress Notes (Deleted)
Referring Provider: Benita StabileHall, John Z, MD Primary Care Physician:  Benita StabileHall, John Z, MD Primary GI:  Dr. Jena Gaussourk  No chief complaint on file.   HPI:   Bonnie Yang is a 32 y.o. female who presents for follow-up on hepatitis C.  The patient was last seen in our office 08/12/2018 for hepatitis C.  Her work-up found her to be genotype 2, treatment nave without cirrhosis.  Plans for Epclusa for 12 weeks.  At his last visit he had just completed 4 weeks of therapy and LFTs normalized.  Hepatitis C RNA resulted to undetectable.  No IV drug use since 2017.  No apparent issues with medication compliance.  She did have some mild itching which was deemed a potential side effect and recommended low-dose Benadryl by pharmacist.  However, the pharmacist recommended she take omeprazole 4 hours prior to [place].  She was given corrected information by our office after only a couple doses that she take Epclusa and then omeprazole 4 hours later.  Recommended continue Epclusa, notify us of any other adverse effects, follow-up in 8 weeks.  Recommended for acid reflux its best not to take omeprazole but if needed she can take it 4 hours after Epclusa versus over-the-counter medications in place of omeprazole including Tums, Rolaids, Pepcid (4 hours apart from [place]).   Today she states   Past Medical History:  Diagnosis Date  . Anemia   . GERD (gastroesophageal reflux disease)   . Hepatitis C antibody test positive   . IV drug abuse (HCC)   . Tobacco abuse   . UTI (urinary tract infection) 06/2016    Past Surgical History:  Procedure Laterality Date  . ABCESS DRAINAGE    . ABCESS DRAINAGE  04/2016   nose   . I&D EXTREMITY Right 06/10/2016   Procedure: IRRIGATION AND DEBRIDEMENT ARM;  Surgeon: Betha LoaKevin Kuzma, MD;  [facility];  Service: Orthopedics;  Laterality: Right;    Current Outpatient Medications  Medication Sig Dispense Refill  . amoxicillin (AMOXIL) 500 MG capsule Take 1 capsule (500 mg total) by  mouth 3 (three) times daily. 30 capsule 0  . amoxicillin-clavulanate (AUGMENTIN) 500-125 MG tablet Take 1 tablet (500 mg total) by mouth 2 (two) times daily. 60 tablet 0  . diphenhydrAMINE (BENADRYL) 25 MG tablet Take 25 mg by mouth every 6 (six) hours as needed. Sometimes takes 1/2 tablet    . EPCLUSA 400-100 MG TABS Take 1 tablet by mouth daily.    . fluticasone (FLONASE) 50 MCG/ACT nasal spray Place 1 spray into both nostrils daily. 16 g 0  . fluticasone (FLONASE) 50 MCG/ACT nasal spray Place 2 sprays into both nostrils daily. 16 g 2  . ibuprofen (ADVIL,MOTRIN) 400 MG tablet Take 1 tablet (400 mg total) by mouth 4 (four) times daily. 30 tablet 0  . Multiple Vitamins-Minerals (HAIR VITAMINS PO) Take by mouth daily.    . Omega-3 Fatty Acids (FISH OIL PO) Take 1 capsule by mouth daily.    Marland Kitchen. omeprazole (PRILOSEC) 20 MG capsule Take 1 capsule (20 mg total) by mouth daily. 30 minutes before breakfast 30 capsule 3   No current facility-administered medications for this visit.     Allergies as of 11/06/2018 - Review Complete 10/29/2018  Allergen Reaction Noted  . Morphine and related Hives 11/30/2015    Family History  Problem Relation Age of Onset  . Hypertension Mother   . Heart failure Mother   . Colon cancer Neg Hx   . Colon  polyps Neg Hx     Social History   Socioeconomic History  . Marital status: Single    Spouse name: Not on file  . Number of children: 0  . Years of education: Not on file  . Highest education level: Not on file  Occupational History  . Not on file  Social Needs  . Financial resource strain: Not on file  . Food insecurity:    Worry: Not on file    Inability: Not on file  . Transportation needs:    Medical: Not on file    Non-medical: Not on file  Tobacco Use  . Smoking status: Current Every Day Smoker    Packs/day: 0.25    Years: 10.00    Pack years: 2.50    Types: Cigarettes  . Smokeless tobacco: Never Used  . Tobacco comment: usually 2-3  cigarettes a day  Substance and Sexual Activity  . Alcohol use: Not Currently    Alcohol/week: 7.0 standard drinks    Types: 7 Cans of beer per week    Comment: denied 08/12/18  . Drug use: Not Currently    Types: Marijuana    Comment: denied 08/12/18  . Sexual activity: Yes    Birth control/protection: None  Lifestyle  . Physical activity:    Days per week: Not on file    Minutes per session: Not on file  . Stress: Not on file  Relationships  . Social connections:    Talks on phone: Not on file    Gets together: Not on file    Attends religious service: Not on file    Active member of club or organization: Not on file    Attends meetings of clubs or organizations: Not on file    Relationship status: Not on file  Other Topics Concern  . Not on file  Social History Narrative  . Not on file    Review of Systems: General: Negative for anorexia, weight loss, fever, chills, fatigue, weakness. Eyes: Negative for vision changes.  ENT: Negative for hoarseness, difficulty swallowing , nasal congestion. CV: Negative for chest pain, angina, palpitations, dyspnea on exertion, peripheral edema.  Respiratory: Negative for dyspnea at rest, dyspnea on exertion, cough, sputum, wheezing.  GI: See history of present illness. GU:  Negative for dysuria, hematuria, urinary incontinence, urinary frequency, nocturnal urination.  MS: Negative for joint pain, low back pain.  Derm: Negative for rash or itching.  Neuro: Negative for weakness, abnormal sensation, seizure, frequent headaches, memory loss, confusion.  Psych: Negative for anxiety, depression, suicidal ideation, hallucinations.  Endo: Negative for unusual weight change.  Heme: Negative for bruising or bleeding. Allergy: Negative for rash or hives.   Physical Exam: There were no vitals taken for this visit. General:   Alert and oriented. Pleasant and cooperative. Well-nourished and well-developed.  Head:  Normocephalic and  atraumatic. Eyes:  Without icterus, sclera clear and conjunctiva pink.  Ears:  Normal auditory acuity. Mouth:  No deformity or lesions, oral mucosa pink.  Throat/Neck:  Supple, without mass or thyromegaly. Cardiovascular:  S1, S2 present without murmurs appreciated. Normal pulses noted. Extremities without clubbing or edema. Respiratory:  Clear to auscultation bilaterally. No wheezes, rales, or rhonchi. No distress.  Gastrointestinal:  +BS, soft, non-tender and non-distended. No HSM noted. No guarding or rebound. No masses appreciated.  Rectal:  Deferred  Musculoskalatal:  Symmetrical without gross deformities. Normal posture. Skin:  Intact without significant lesions or rashes. Neurologic:  Alert and oriented x4;  grossly normal neurologically. Psych:  Alert and cooperative. Normal mood and affect. Heme/Lymph/Immune: No significant cervical adenopathy. No excessive bruising noted.    11/06/2018 7:41 AM   Disclaimer: This note was dictated with voice recognition software. Similar sounding words can inadvertently be transcribed and may not be corrected upon review.

## 2018-11-06 NOTE — Telephone Encounter (Signed)
Noted  

## 2018-11-06 NOTE — Telephone Encounter (Signed)
Patient no show x 3, received first no show policy letter 02/13/18

## 2018-11-06 NOTE — Telephone Encounter (Signed)
Next no show will result in a discharge letter

## 2018-12-02 ENCOUNTER — Other Ambulatory Visit (HOSPITAL_COMMUNITY)
Admission: RE | Admit: 2018-12-02 | Discharge: 2018-12-02 | Disposition: A | Discharge status: Home / Self Care | Payer: 59 | Source: Ambulatory Visit | Attending: Preventative Medicine | Admitting: Preventative Medicine

## 2018-12-02 DIAGNOSIS — Z7721 Contact with and (suspected) exposure to potentially hazardous body fluids: Secondary | ICD-10-CM | POA: Diagnosis present

## 2018-12-02 LAB — ALT: ALT: 16 U/L (ref 0–44)

## 2018-12-03 LAB — HIV ANTIBODY (ROUTINE TESTING W REFLEX): HIV Screen 4th Generation wRfx: NONREACTIVE

## 2018-12-03 LAB — HCV RT-PCR, QUANT (NON-GRAPH): Hepatitis C Quantitation: NOT DETECTED IU/mL

## 2018-12-03 LAB — HEPATITIS B SURFACE ANTIBODY, QUANTITATIVE: Hep B S AB Quant (Post): 309.3 m[IU]/mL (ref >9.9)

## 2018-12-03 LAB — HEPATITIS B SURFACE ANTIGEN: Hepatitis B Surface Ag: NEGATIVE

## 2018-12-03 LAB — HCV AB W REFLEX TO QUANT PCR: HCV Ab: >11 s/co ratio — ABNORMAL HIGH (ref 0.0–0.9)

## 2019-12-24 ENCOUNTER — Other Ambulatory Visit: Payer: Self-pay

## 2019-12-24 ENCOUNTER — Ambulatory Visit: Payer: Self-pay | Attending: Family

## 2019-12-24 DIAGNOSIS — Z20822 Contact with and (suspected) exposure to covid-19: Secondary | ICD-10-CM

## 2019-12-25 LAB — NOVEL CORONAVIRUS, NAA: SARS-CoV-2, NAA: NOT DETECTED

## 2019-12-30 ENCOUNTER — Telehealth: Payer: Self-pay

## 2019-12-30 NOTE — Telephone Encounter (Signed)
COVID results have been faxed per pt. Request.

## 2019-12-30 NOTE — Telephone Encounter (Signed)
Patient needs a result faxed [facility]  AttnViona Gilmore  Fax: [phone]

## 2020-05-14 ENCOUNTER — Encounter (HOSPITAL_COMMUNITY): Payer: Self-pay

## 2020-05-14 ENCOUNTER — Other Ambulatory Visit: Payer: Self-pay

## 2020-05-14 ENCOUNTER — Emergency Department (HOSPITAL_COMMUNITY): Admission: EM | Admit: 2020-05-14 | Discharge: 2020-05-14 | Discharge status: Against Medical Advice | Payer: Self-pay

## 2020-05-14 DIAGNOSIS — J329 Chronic sinusitis, unspecified: Secondary | ICD-10-CM | POA: Insufficient documentation

## 2020-05-14 DIAGNOSIS — Z5321 Procedure and treatment not carried out due to patient leaving prior to being seen by health care provider: Secondary | ICD-10-CM | POA: Insufficient documentation

## 2020-05-14 NOTE — ED Triage Notes (Signed)
Not in waiting area x 2 

## 2020-05-14 NOTE — ED Triage Notes (Addendum)
Pt comes in for wanting to be seen for a sinus infection she feels like has been going on too long. States she has green mucus going down her throat and pressure in her eyes  Says that she has been feeling like this for a couple months.

## 2020-05-15 ENCOUNTER — Emergency Department (HOSPITAL_COMMUNITY)
Admission: EM | Admit: 2020-05-15 | Discharge: 2020-05-15 | Disposition: A | Discharge status: Home / Self Care | Payer: Self-pay | Attending: Emergency Medicine | Admitting: Emergency Medicine

## 2020-07-07 ENCOUNTER — Ambulatory Visit: Payer: Self-pay | Admitting: Adult Health

## 2020-07-15 ENCOUNTER — Emergency Department (HOSPITAL_COMMUNITY)
Admission: EM | Admit: 2020-07-15 | Discharge: 2020-07-15 | Disposition: A | Discharge status: Home / Self Care | Payer: BC Managed Care – PPO | Attending: Emergency Medicine | Admitting: Emergency Medicine

## 2020-07-15 ENCOUNTER — Other Ambulatory Visit: Payer: Self-pay

## 2020-07-15 ENCOUNTER — Encounter (HOSPITAL_COMMUNITY): Payer: Self-pay

## 2020-07-15 DIAGNOSIS — Z20822 Contact with and (suspected) exposure to covid-19: Secondary | ICD-10-CM

## 2020-07-15 DIAGNOSIS — R197 Diarrhea, unspecified: Secondary | ICD-10-CM | POA: Insufficient documentation

## 2020-07-15 DIAGNOSIS — J019 Acute sinusitis, unspecified: Secondary | ICD-10-CM | POA: Insufficient documentation

## 2020-07-15 DIAGNOSIS — J329 Chronic sinusitis, unspecified: Secondary | ICD-10-CM

## 2020-07-15 DIAGNOSIS — J3489 Other specified disorders of nose and nasal sinuses: Secondary | ICD-10-CM | POA: Diagnosis not present

## 2020-07-15 DIAGNOSIS — F17210 Nicotine dependence, cigarettes, uncomplicated: Secondary | ICD-10-CM | POA: Diagnosis not present

## 2020-07-15 DIAGNOSIS — R112 Nausea with vomiting, unspecified: Secondary | ICD-10-CM | POA: Diagnosis not present

## 2020-07-15 LAB — SARS CORONAVIRUS 2 BY RT PCR (HOSPITAL ORDER, PERFORMED IN ~~LOC~~ HOSPITAL LAB): SARS Coronavirus 2: NEGATIVE

## 2020-07-15 NOTE — Discharge Instructions (Addendum)
As discussed, please make sure to follow-up with Dr. Suszanne Conners back to chronic sinus issues. You were tested for COVID-19 today, make sure to quarantine until you receive the results. If this is positive, please make sure to quarantine additional 7 to 10 days. You may take ibuprofen/Tylenol for body aches and fevers, drink plenty of fluids, over-the-counter cold and flu medications. You may also buy an over-the-counter pulse oximeter which can be found at your local pharmacy. If your oxygen saturation drops below 90%, please make sure to return to the ER. Return to the ER for any worsening shortness of breath.      Person Under Monitoring Name: Bonnie Yang  Location: [address] [place] [address]   Infection Prevention Recommendations for Individuals Confirmed to have, or Being Evaluated for, 2019 Novel Coronavirus (COVID-19) Infection Who Receive Care at Home  Individuals who are confirmed to have, or are being evaluated for, COVID-19 should follow the prevention steps below until a healthcare provider or local or state health department says they can return to normal activities.  Stay home except to get medical care You should restrict activities outside your home, except for getting medical care. Do not go to work, school, or public areas, and do not use public transportation or taxis.  Call ahead before visiting your doctor Before your medical appointment, call the healthcare provider and tell them that you have, or are being evaluated for, COVID-19 infection. This will help the healthcare provider's office take steps to keep other people from getting infected. Ask your healthcare provider to call the local or state health department.  Monitor your symptoms Seek prompt medical attention if your illness is worsening (e.g., difficulty breathing). Before going to your medical appointment, call the healthcare provider and tell them that you have, or are being evaluated  for, COVID-19 infection. Ask your healthcare provider to call the local or state health department.  Wear a facemask You should wear a facemask that covers your nose and mouth when you are in the same room with other people and when you visit a healthcare provider. People who live with or visit you should also wear a facemask while they are in the same room with you.  Separate yourself from other people in your home As much as possible, you should stay in a different room from other people in your home. Also, you should use a separate bathroom, if available.  Avoid sharing household items You should not share dishes, drinking glasses, cups, eating utensils, towels, bedding, or other items with other people in your home. After using these items, you should wash them thoroughly with soap and water.  Cover your coughs and sneezes Cover your mouth and nose with a tissue when you cough or sneeze, or you can cough or sneeze into your sleeve. Throw used tissues in a lined trash can, and immediately wash your hands with soap and water for at least 20 seconds or use an alcohol-based hand rub.  Wash your [employer] your hands often and thoroughly with soap and water for at least 20 seconds. You can use an alcohol-based hand sanitizer if soap and water are not available and if your hands are not visibly dirty. Avoid touching your eyes, nose, and mouth with unwashed hands.   Prevention Steps for Caregivers and Household Members of Individuals Confirmed to have, or Being Evaluated for, COVID-19 Infection Being Cared for in the Home  If you live with, or provide care at home  for, a person confirmed to have, or being evaluated for, COVID-19 infection please follow these guidelines to prevent infection:  Follow healthcare provider's instructions Make sure that you understand and can help the patient follow any healthcare provider instructions for all care.  Provide for the patient's basic  needs You should help the patient with basic needs in the home and provide support for getting groceries, prescriptions, and other personal needs.  Monitor the patient's symptoms If they are getting sicker, call his or her medical provider and tell them that the patient has, or is being evaluated for, COVID-19 infection. This will help the healthcare provider's office take steps to keep other people from getting infected. Ask the healthcare provider to call the local or state health department.  Limit the number of people who have contact with the patient If possible, have only one caregiver for the patient. Other household members should stay in another home or place of residence. If this is not possible, they should stay in another room, or be separated from the patient as much as possible. Use a separate bathroom, if available. Restrict visitors who do not have an essential need to be in the home.  Keep older adults, very young children, and other sick people away from the patient Keep older adults, very young children, and those who have compromised immune systems or chronic health conditions away from the patient. This includes people with chronic heart, lung, or kidney conditions, diabetes, and cancer.  Ensure good ventilation Make sure that shared spaces in the home have good air flow, such as from an air conditioner or an opened window, weather permitting.  Wash your hands often Wash your hands often and thoroughly with soap and water for at least 20 seconds. You can use an alcohol based hand sanitizer if soap and water are not available and if your hands are not visibly dirty. Avoid touching your eyes, nose, and mouth with unwashed hands. Use disposable paper towels to dry your hands. If not available, use dedicated cloth towels and replace them when they become wet.  Wear a facemask and gloves Wear a disposable facemask at all times in the room and gloves when you touch or have  contact with the patient's blood, body fluids, and/or secretions or excretions, such as sweat, saliva, sputum, nasal mucus, vomit, urine, or feces.  Ensure the mask fits over your nose and mouth tightly, and do not touch it during use. Throw out disposable facemasks and gloves after using them. Do not reuse. Wash your hands immediately after removing your facemask and gloves. If your personal clothing becomes contaminated, carefully remove clothing and launder. Wash your hands after handling contaminated clothing. Place all used disposable facemasks, gloves, and other waste in a lined container before disposing them with other household waste. Remove gloves and wash your hands immediately after handling these items.  Do not share dishes, glasses, or other household items with the patient Avoid sharing household items. You should not share dishes, drinking glasses, cups, eating utensils, towels, bedding, or other items with a patient who is confirmed to have, or being evaluated for, COVID-19 infection. After the person uses these items, you should wash them thoroughly with soap and water.  Wash laundry thoroughly Immediately remove and wash clothes or bedding that have blood, body fluids, and/or secretions or excretions, such as sweat, saliva, sputum, nasal mucus, vomit, urine, or feces, on them. Wear gloves when handling laundry from the patient. Read and follow directions on labels of  laundry or clothing items and detergent. In general, wash and dry with the warmest temperatures recommended on the label.  Clean all areas the individual has used often Clean all touchable surfaces, such as counters, tabletops, doorknobs, bathroom fixtures, toilets, phones, keyboards, tablets, and bedside tables, every day. Also, clean any surfaces that may have blood, body fluids, and/or secretions or excretions on them. Wear gloves when cleaning surfaces the patient has come in contact with. Use a diluted bleach  solution (e.g., dilute bleach with 1 part bleach and 10 parts water) or a household disinfectant with a label that says EPA-registered for coronaviruses. To make a bleach solution at home, add 1 tablespoon of bleach to 1 quart (4 cups) of water. For a larger supply, add  cup of bleach to 1 gallon (16 cups) of water. Read labels of cleaning products and follow recommendations provided on product labels. Labels contain instructions for safe and effective use of the cleaning product including precautions you should take when applying the product, such as wearing gloves or eye protection and making sure you have good ventilation during use of the product. Remove gloves and wash hands immediately after cleaning.  Monitor yourself for signs and symptoms of illness Caregivers and household members are considered close contacts, should monitor their health, and will be asked to limit movement outside of the home to the extent possible. Follow the monitoring steps for close contacts listed on the symptom monitoring form.   ? If you have additional questions, contact your local health department or call the epidemiologist on call at [phone] (available 24/7). ? This guidance is subject to change. For the most up-to-date guidance from [facility], please refer to their website: [email or web address]

## 2020-07-15 NOTE — ED Triage Notes (Signed)
Pt presents to ED with complaints of nasal congestion, low grade fever, diarrhea, productive cough and stopped up ears x 1 week.

## 2020-07-15 NOTE — ED Provider Notes (Signed)
[facility] EMERGENCY DEPARTMENT Provider Note   CSN: [id number] Arrival date & time: 07/15/20  5465     History Chief Complaint  Patient presents with  . Nasal Congestion    Bonnie Yang is a 34 y.o. female.  HPI 34 year old female with history of GERD, hep C,IV drug use presents to the ER with a 66-month history of sinus issues. She states that she was seen here several months ago and given amoxicillin with complaints of sinusitis. She followed up with Dr. Gearldine Bienenstock, who told her that antibiotics would likely not work and she needs to get her sinuses out. She has not followed up with him about this, but recently got insurance and plans to follow-up. She states that 1 day ago, she started to develop body aches, some episodes of nausea and vomiting, loss of taste and smell, and some nonbloody diarrhea. She also complains of feeling like her ears are stopped up. No sore throat. She is also developed a productive cough with yellow sputum. No blood. No chest pain or shortness of breath. Denies any recent history of IV drug use, states that she has been clean for multiple years. She is not vaccinated for Covid. She presents to the ER wondering if she needs an antibiotic, wants a Covid test and a note for work.    Past Medical History:  Diagnosis Date  . Anemia   . GERD (gastroesophageal reflux disease)   . Hepatitis C antibody test positive   . IV drug abuse (HCC)   . Tobacco abuse   . UTI (urinary tract infection) 06/2016    Patient Active Problem List   Diagnosis Date Noted  . Chronic hepatitis C without hepatic coma (HCC) 05/22/2018  . GERD (gastroesophageal reflux disease) 05/22/2018  . IV drug abuse (HCC)   . Hepatitis C antibody test positive   . Cellulitis of elbow 06/10/2016  . UTI (lower urinary tract infection) 06/10/2016  . Heroin use 06/10/2016  . Facial cellulitis 04/02/2015    Past Surgical History:  Procedure Laterality Date  . ABCESS DRAINAGE    . ABCESS DRAINAGE   04/2016   nose   . I & D EXTREMITY Right 06/10/2016   Procedure: IRRIGATION AND DEBRIDEMENT ARM;  Surgeon: Betha Loa, MD;  Location: [facility] OR;  Service: Orthopedics;  Laterality: Right;     OB History   No obstetric history on file.     Family History  Problem Relation Age of Onset  . Hypertension Mother   . Heart failure Mother   . Colon cancer Neg Hx   . Colon polyps Neg Hx     Social History   Tobacco Use  . Smoking status: Current Every Day Smoker    Packs/day: 0.25    Years: 10.00    Pack years: 2.50    Types: Cigarettes  . Smokeless tobacco: Never Used  . Tobacco comment: usually 2-3 cigarettes a day  Vaping Use  . Vaping Use: Never used  Substance Use Topics  . Alcohol use: Yes    Alcohol/week: 7.0 standard drinks    Types: 7 Cans of beer per week    Comment: 1 beer every other day   . Drug use: Not Currently    Types: Marijuana    Home Medications Prior to Admission medications   Medication Sig Start Date End Date Taking? Authorizing Provider  amoxicillin (AMOXIL) 500 MG capsule Take 1 capsule (500 mg total) by mouth 3 (three) times daily. 10/29/18   Donnetta Hutching,  MD  amoxicillin-clavulanate (AUGMENTIN) 500-125 MG tablet Take 1 tablet (500 mg total) by mouth 2 (two) times daily. 10/29/18   Donnetta Hutching, MD  diphenhydrAMINE (BENADRYL) 25 MG tablet Take 25 mg by mouth every 6 (six) hours as needed. Sometimes takes 1/2 tablet    [provider]  EPCLUSA 400-100 MG TABS Take 1 tablet by mouth daily. 08/07/18   [provider]  fluticasone (FLONASE) 50 MCG/ACT nasal spray Place 1 spray into both nostrils daily. 12/16/17   Maczis, Elmer Sow, PA-C  fluticasone (FLONASE) 50 MCG/ACT nasal spray Place 2 sprays into both nostrils daily. 10/29/18   Donnetta Hutching, MD  ibuprofen (ADVIL,MOTRIN) 400 MG tablet Take 1 tablet (400 mg total) by mouth 4 (four) times daily. 06/11/18   Ivery Quale, PA-C  Multiple Vitamins-Minerals (HAIR VITAMINS PO) Take by mouth daily.     [provider]  Omega-3 Fatty Acids (FISH OIL PO) Take 1 capsule by mouth daily.    [provider]  omeprazole (PRILOSEC) 20 MG capsule Take 1 capsule (20 mg total) by mouth daily. 30 minutes before breakfast 05/22/18   Gelene Mink, NP    Allergies    Morphine and related  Review of Systems   Review of Systems  Constitutional: Negative for chills and fever.  HENT: Positive for congestion, postnasal drip, rhinorrhea, sinus pressure and sneezing. Negative for drooling, ear discharge, facial swelling, hearing loss, sinus pain, sore throat, tinnitus, trouble swallowing and voice change.   Respiratory: Positive for cough. Negative for choking and shortness of breath.   Cardiovascular: Negative for chest pain.  Gastrointestinal: Positive for diarrhea, nausea and vomiting. Negative for abdominal pain.  Genitourinary: Negative for dysuria and flank pain.    Physical Exam Updated Vital Signs BP 126/75 (BP Location: Right Arm)   Pulse 76   Temp 98.3 F (36.8 C) (Oral)   Resp 16   Ht 5\' 5"  (1.651 m)   Wt 46.7 kg   LMP 06/27/2020   SpO2 100%   BMI 17.14 kg/m   Physical Exam Vitals and nursing note reviewed.  Constitutional:      General: She is not in acute distress.    Appearance: Normal appearance. She is well-developed. She is obese. She is not ill-appearing or diaphoretic.  HENT:     Head: Normocephalic and atraumatic.     Comments: Mild tenderness to palpation to the maxillary and frontal sinuses    Right Ear: Tympanic membrane normal.     Left Ear: Tympanic membrane normal.     Ears:     Comments: Large amount of cerumen in ears bilaterally      Nose: Nose normal.     Mouth/Throat:     Mouth: Mucous membranes are moist.     Pharynx: Oropharynx is clear.     Comments: Oropharynx non erythematous without exudates, uvula midline, no unilateral tonsillar swelling, tongue normal size and midline, no sublingual/submandibular swellimg, tolerating secretions  well    Eyes:     Conjunctiva/sclera: Conjunctivae normal.     Pupils: Pupils are equal, round, and reactive to light.  Cardiovascular:     Rate and Rhythm: Normal rate and regular rhythm.     Pulses: Normal pulses.     Heart sounds: Normal heart sounds. No murmur heard.   Pulmonary:     Effort: Pulmonary effort is normal. No respiratory distress.     Breath sounds: Normal breath sounds.     Comments: Respirations equal and unlabored, patient able to speak  in full sentences, lungs clear to auscultation bilaterally  Abdominal:     General: Abdomen is flat.     Palpations: Abdomen is soft.     Tenderness: There is no abdominal tenderness.  Musculoskeletal:        General: No tenderness or deformity. Normal range of motion.     Cervical back: Normal range of motion and neck supple.  Skin:    General: Skin is warm.     Capillary Refill: Capillary refill takes less than 2 seconds.     Findings: No erythema or rash.  Neurological:     General: No focal deficit present.     Mental Status: She is alert and oriented to person, place, and time.     Cranial Nerves: No cranial nerve deficit.     Coordination: Coordination normal.  Psychiatric:        Mood and Affect: Mood normal.        Behavior: Behavior normal.     ED Results / Procedures / Treatments   Labs (all labs ordered are listed, but only abnormal results are displayed) Labs Reviewed  SARS CORONAVIRUS 2 BY RT PCR (HOSPITAL ORDER, PERFORMED IN [facility])    EKG None  Radiology No results found.  Procedures Procedures (including critical care time)  Medications Ordered in ED Medications - No data to display  ED Course  I have reviewed the triage vital signs and the nursing notes.  Pertinent labs & imaging results that were available during my care of the patient were reviewed by me and considered in my medical decision making (see chart for details).    MDM Rules/Calculators/A&P                          Patient here with complaints of chronic sinus congestion, with recent development of body aches, loss of taste and smell, productive cough, diarrhea. Vitals overall reassuring, she is not hypoxic, tachycardic or tachypneic. Lung sounds clear. Oropharynx without evidence of erythema, tonsillar swelling. No evidence of respiratory distress. I discussed with the patient that given that she has failed multiple antibiotics for sinusitis, I stressed that she needs to follow-up with Dr. Suszanne Conners for further management. Patient was swabbed for Covid, discussed quarantine measures. Patient denies any recent IV drug use, states he has been clean for several years. Concern for superimposed infection low. Return precautions discussed. She voices understanding and is agreeable. At this stage in the ED course, the patient is medically screened and stable for discharge.  Final Clinical Impression(s) / ED Diagnoses Final diagnoses:  Sinusitis, unspecified chronicity, unspecified location  Encounter for laboratory testing for COVID-19 virus    Rx / DC Orders ED Discharge Orders    None       Leone Brand 07/15/20 1236    Sabas Sous, MD 07/15/20 [phone]

## 2020-07-17 ENCOUNTER — Emergency Department (HOSPITAL_COMMUNITY)
Admission: EM | Admit: 2020-07-17 | Discharge: 2020-07-17 | Disposition: A | Discharge status: Home / Self Care | Payer: BC Managed Care – PPO | Attending: Emergency Medicine | Admitting: Emergency Medicine

## 2020-07-17 ENCOUNTER — Other Ambulatory Visit: Payer: Self-pay

## 2020-07-17 ENCOUNTER — Encounter (HOSPITAL_COMMUNITY): Payer: Self-pay | Admitting: Emergency Medicine

## 2020-07-17 DIAGNOSIS — Y9389 Activity, other specified: Secondary | ICD-10-CM | POA: Diagnosis not present

## 2020-07-17 DIAGNOSIS — Y92838 Other recreation area as the place of occurrence of the external cause: Secondary | ICD-10-CM | POA: Diagnosis not present

## 2020-07-17 DIAGNOSIS — T25222A Burn of second degree of left foot, initial encounter: Secondary | ICD-10-CM

## 2020-07-17 DIAGNOSIS — Y998 Other external cause status: Secondary | ICD-10-CM | POA: Insufficient documentation

## 2020-07-17 DIAGNOSIS — X118XXA Contact with other hot tap-water, initial encounter: Secondary | ICD-10-CM | POA: Insufficient documentation

## 2020-07-17 DIAGNOSIS — F17210 Nicotine dependence, cigarettes, uncomplicated: Secondary | ICD-10-CM | POA: Diagnosis not present

## 2020-07-17 MED ORDER — IBUPROFEN 800 MG PO TABS: 800.0000 mg | ORAL_TABLET | Freq: Three times a day (TID) | ORAL | 0 refills | Status: AC

## 2020-07-17 MED ORDER — SILVER SULFADIAZINE 1 % EX CREA
TOPICAL_CREAM | Freq: Once | CUTANEOUS | Status: AC
  Filled 2020-07-17: qty 50
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
## 2020-07-17 NOTE — Discharge Instructions (Signed)
Clean your foot with mild soap and water.  Wash off and reapply the Silvadene cream twice daily.  Keep the foot bandaged.  Follow-up with your primary doctor for recheck, return to the emergency department for any signs of infection.

## 2020-07-20 ENCOUNTER — Emergency Department (HOSPITAL_COMMUNITY)
Admission: EM | Admit: 2020-07-20 | Discharge: 2020-07-20 | Disposition: A | Discharge status: Home / Self Care | Payer: BC Managed Care – PPO

## 2020-07-20 ENCOUNTER — Other Ambulatory Visit: Payer: Self-pay

## 2020-07-21 ENCOUNTER — Ambulatory Visit
Admission: EM | Admit: 2020-07-21 | Discharge: 2020-07-21 | Disposition: A | Discharge status: Home / Self Care | Payer: BC Managed Care – PPO

## 2020-07-21 DIAGNOSIS — Z5189 Encounter for other specified aftercare: Secondary | ICD-10-CM | POA: Diagnosis not present

## 2020-07-21 DIAGNOSIS — T25222A Burn of second degree of left foot, initial encounter: Secondary | ICD-10-CM | POA: Diagnosis not present

## 2020-07-21 NOTE — Discharge Instructions (Addendum)
Keep the wound clean with warm water and soap Take OTC Tylenol/ibuprofen as needed for pain Apply thin layer of Silvadene as prescribed Follow-up with PCP Return or go to ED for worsening symptoms
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
## 2020-08-24 ENCOUNTER — Encounter: Payer: Self-pay | Admitting: Internal Medicine

## 2020-08-24 ENCOUNTER — Ambulatory Visit: Payer: Self-pay | Admitting: Gastroenterology

## 2020-10-06 ENCOUNTER — Other Ambulatory Visit: Payer: BC Managed Care – PPO | Admitting: Adult Health
